# Patient Record
Sex: Female | Born: 1940 | Race: White | Hispanic: No | Marital: Married | Smoking: Never smoker
Health system: Southern US, Community
[De-identification: ages and names within clinical notes are randomized; demographics above are authoritative.]

## PROBLEM LIST (undated history)

## (undated) DIAGNOSIS — I10 Essential (primary) hypertension: Secondary | ICD-10-CM

## (undated) DIAGNOSIS — IMO0002 Reserved for concepts with insufficient information to code with codable children: Secondary | ICD-10-CM

## (undated) DIAGNOSIS — N390 Urinary tract infection, site not specified: Secondary | ICD-10-CM

## (undated) DIAGNOSIS — N6019 Diffuse cystic mastopathy of unspecified breast: Secondary | ICD-10-CM

## (undated) DIAGNOSIS — IMO0001 Reserved for inherently not codable concepts without codable children: Secondary | ICD-10-CM

## (undated) DIAGNOSIS — F329 Major depressive disorder, single episode, unspecified: Secondary | ICD-10-CM

## (undated) DIAGNOSIS — I452 Bifascicular block: Secondary | ICD-10-CM

## (undated) DIAGNOSIS — F32A Depression, unspecified: Secondary | ICD-10-CM

## (undated) DIAGNOSIS — E039 Hypothyroidism, unspecified: Secondary | ICD-10-CM

## (undated) HISTORY — DX: Major depressive disorder, single episode, unspecified: F32.9

## (undated) HISTORY — DX: Reserved for concepts with insufficient information to code with codable children: IMO0002

## (undated) HISTORY — PX: OTHER SURGICAL HISTORY: SHX169

## (undated) HISTORY — DX: Diffuse cystic mastopathy of unspecified breast: N60.19

## (undated) HISTORY — DX: Reserved for inherently not codable concepts without codable children: IMO0001

## (undated) HISTORY — PX: ABDOMINAL HYSTERECTOMY: SHX81

## (undated) HISTORY — DX: Essential (primary) hypertension: I10

## (undated) HISTORY — DX: Bifascicular block: I45.2

## (undated) HISTORY — PX: APPENDECTOMY: SHX54

## (undated) HISTORY — DX: Urinary tract infection, site not specified: N39.0

## (undated) HISTORY — DX: Depression, unspecified: F32.A

---

## 2005-04-25 ENCOUNTER — Ambulatory Visit: Payer: Self-pay | Admitting: Internal Medicine

## 2006-04-28 ENCOUNTER — Ambulatory Visit: Payer: Self-pay | Admitting: Internal Medicine

## 2007-10-01 ENCOUNTER — Ambulatory Visit: Payer: Self-pay | Admitting: Internal Medicine

## 2008-10-18 ENCOUNTER — Encounter: Payer: Self-pay | Admitting: Cardiology

## 2008-10-26 ENCOUNTER — Ambulatory Visit: Payer: Self-pay | Admitting: Internal Medicine

## 2008-11-14 ENCOUNTER — Ambulatory Visit: Payer: Self-pay | Admitting: Surgery

## 2008-11-18 ENCOUNTER — Ambulatory Visit: Payer: Self-pay | Admitting: Surgery

## 2009-02-27 ENCOUNTER — Encounter: Payer: Self-pay | Admitting: Cardiology

## 2009-03-03 ENCOUNTER — Encounter (INDEPENDENT_AMBULATORY_CARE_PROVIDER_SITE_OTHER): Payer: Self-pay | Admitting: Surgery

## 2009-03-03 ENCOUNTER — Ambulatory Visit (HOSPITAL_COMMUNITY): Admission: RE | Admit: 2009-03-03 | Discharge: 2009-03-04 | Payer: Self-pay | Admitting: Surgery

## 2009-04-19 ENCOUNTER — Encounter: Payer: Self-pay | Admitting: Cardiology

## 2009-04-25 ENCOUNTER — Encounter: Payer: Self-pay | Admitting: Cardiology

## 2009-04-25 ENCOUNTER — Ambulatory Visit: Payer: Self-pay

## 2009-04-25 ENCOUNTER — Ambulatory Visit: Payer: Self-pay | Admitting: Cardiology

## 2009-04-25 DIAGNOSIS — I10 Essential (primary) hypertension: Secondary | ICD-10-CM | POA: Insufficient documentation

## 2009-04-25 DIAGNOSIS — R9431 Abnormal electrocardiogram [ECG] [EKG]: Secondary | ICD-10-CM

## 2009-05-09 ENCOUNTER — Ambulatory Visit: Payer: Self-pay | Admitting: Cardiovascular Disease

## 2009-05-09 ENCOUNTER — Encounter: Payer: Self-pay | Admitting: Cardiology

## 2009-05-10 LAB — CONVERTED CEMR LAB
CO2: 24 meq/L (ref 19–32)
Calcium: 9.2 mg/dL (ref 8.4–10.5)
Chloride: 103 meq/L (ref 96–112)
HDL: 58 mg/dL (ref >39)
LDL Cholesterol: 120 mg/dL — ABNORMAL HIGH (ref 0–99)
Sodium: 141 meq/L (ref 135–145)
Total CHOL/HDL Ratio: 3.4
Triglycerides: 87 mg/dL (ref <150)

## 2009-05-22 ENCOUNTER — Ambulatory Visit: Payer: Self-pay | Admitting: Cardiology

## 2009-11-08 ENCOUNTER — Ambulatory Visit: Payer: Self-pay | Admitting: Internal Medicine

## 2010-06-26 ENCOUNTER — Encounter: Payer: Self-pay | Admitting: Cardiovascular Disease

## 2010-06-26 ENCOUNTER — Ambulatory Visit: Payer: Self-pay | Admitting: Cardiovascular Disease

## 2010-08-02 NOTE — Assessment & Plan Note (Signed)
Summary: EC6/AMD   Visit Type:  Initial Consult Referring Fusae Florio:  Aram Beecham, MD Primary Landrum Carbonell:  Osborne Oman, MD  CC:  establish care. "doing well" denies chest pain and SOB.Lorraine Ferguson  History of Present Illness: 70 yo with history of HTN and hyperparathyroidism s/p parathyroidectomy, 9/10,   ABN EKG with RBBB and left anterior fascicular block on her ECG, which was the same from  [facility] 10-12 years ago, presents for routine followup.  She states that she is very sedentary. She does not do any walking or exercise. her husband is active. She denies any significant new complaints. No lightheadedness or dizziness. she has not been checking her blood pressure at home though she does have a blood pressure cuff. She does report that the Diovan he is quite extensive. When asked if she is tried other medications, she used to be on Benicar though insurance stopped covering this.  Labs (11/10): K 4.2, creatinine 0.91, LDL 120, HDL 58  EKG shows normal sinus rhythm with rate of 75 beats per minute, right bundle branch block with left anterior fascicular block, nonspecific ST changes in leads V1 through V3, T-wave abnormality in 3 and aVF  Current Medications (verified): 1)  Aspirin 81 Mg Tbec (Aspirin) .... Take One Tablet By Mouth Daily 2)  Fish Oil 1000 Mg Caps (Omega-3 Fatty Acids) .Lorraine Ferguson.. 1 By Mouth Once Daily 3)  Diovan 320 Mg Tabs (Valsartan) .Lorraine Ferguson.. 1 Tab By Mouth Daily 4)  Vitamin B-12 Injection .Lorraine Ferguson.. 1 Injection Monthly 5)  Vitamin D .... 1 Tablet Daily  Allergies (verified): No Known Drug Allergies  Past History:  Past Medical History: Last updated: 05/22/2009 1. HTN 2. History of Chiari malformation with cervical syringomyelia, s/p surgery at [facility] 10-12 years ago.  3. Depression 4. Recurrent UTIs and urinary incontinence.  5. Hyperparathyroidism s/p parathyroidectomy in 9/10 6. Fibrocystic breast disease 7. Echo (10/10): EF 50-55%, mild LVH, trivial AI, no other valvular  disease. 8. RBBB with LAFB  Past Surgical History: Last updated: 05-07-2009 Chiari Malformation Parathyroid surgery Leg vein hysterectomy  Family History: Last updated: May 07, 2009 Mother: deceased 80; aspirated, Alzheimers, stroke Father: deceased 49: heart failure; heart valve replaced Brother: living, has pacemaker No premature CAD    Social History: Last updated: May 07, 2009 Retired, lives in [place] Married  Tobacco Use - No. Never smoked.  Alcohol Use - no Regular Exercise - no Drug Use - no  Risk Factors: Alcohol Use: 0 (2009-05-07) Caffeine Use: 2/3 cups (05-07-09) Exercise: no (May 07, 2009)  Risk Factors: Smoking Status: never (May 07, 2009)  Review of Systems  The patient denies fever, weight loss, weight gain, vision loss, decreased hearing, hoarseness, chest pain, syncope, dyspnea on exertion, peripheral edema, prolonged cough, abdominal pain, incontinence, muscle weakness, depression, and enlarged lymph nodes.    Vital Signs:  Patient profile:   70 year old female Height:      62 inches Weight:      134.25 pounds BMI:     24.64 Pulse rate:   75 / minute BP sitting:   142 / 72  (left arm) Cuff size:   regular  Vitals Entered By: Lysbeth Galas CMA (June 26, 2010 2:38 PM)  Physical Exam  General:  Well developed, well nourished, in no acute distress. Head:  normocephalic and atraumatic Neck:  Neck supple, no JVD. No masses, thyromegaly or abnormal cervical nodes. Lungs:  Clear bilaterally to auscultation and percussion. Heart:  Non-displaced PMI, chest non-tender; regular rate and rhythm, S1, S2 without murmurs or gallops. Soft S4. Carotid  upstroke normal, no bruit.  Pedals normal pulses. No edema, no varicosities. Abdomen:  Bowel sounds positive; abdomen soft and non-tender without masses Msk:  Back normal, normal gait. Muscle strength and tone normal. Pulses:  pulses normal in all 4 extremities Extremities:  No clubbing or  cyanosis. Neurologic:  Alert and oriented x 3. Skin:  Intact without lesions or rashes. Psych:  she does seem to be somewhat depressed.   Impression & Recommendations:  Problem # 1:  ELECTROCARDIOGRAM, ABNORMAL (ICD-794.31) abnormal EKG which has been the same over many years. No symptoms at this time. No further workup.  Her updated medication list for this problem includes:    Aspirin 81 Mg Tbec (Aspirin) .Lorraine Ferguson... Take one tablet by mouth daily  Problem # 2:  HYPERTENSION, UNSPECIFIED (ICD-401.9) Blood pressure is adequate. She does report that Diovan is very expensive for her. We will try losartan 100 mg daily and have asked her to monitor her pressure at home and call our office with some numbers. If it is elevated, we could either change her back to Diovan or add low-dose HCTZ.  The following medications were removed from the medication list:    Diovan 320 Mg Tabs (Valsartan) .Lorraine Ferguson... 1 tab by mouth daily Her updated medication list for this problem includes:    Aspirin 81 Mg Tbec (Aspirin) .Lorraine Ferguson... Take one tablet by mouth daily    Losartan Potassium 100 Mg Tabs (Losartan potassium) .Lorraine Ferguson... Take one tablet by mouth once daily.  Patient Instructions: 1)  Your physician recommends that you schedule a follow-up appointment in: 1 year 2)  Your physician has recommended you make the following change in your medication: STOP Diovan. START Losartan 100mg  once daily. Prescriptions: LOSARTAN POTASSIUM 100 MG TABS (LOSARTAN POTASSIUM) Take one tablet by mouth once daily.  #90 x 3   Entered by:   Lanny Hurst RN   Authorized by:   Dossie Arbour MD   Signed by:   Lanny Hurst RN on 06/26/2010   Method used:   Faxed to ...       [facility] (mail-order)             , [place]         Ph: [phone]       Fax: [phone]   RxID:   [id number]

## 2010-09-28 ENCOUNTER — Telehealth: Payer: Self-pay | Admitting: Cardiovascular Disease

## 2010-09-28 MED ORDER — HYDROCHLOROTHIAZIDE 25 MG PO TABS: 12.5000 mg | ORAL_TABLET | Freq: Every day | ORAL | Status: DC

## 2010-09-28 NOTE — Telephone Encounter (Signed)
Pt started Losartan 2 months ago and BP has been elevated for the past several weeks.

## 2010-09-28 NOTE — Telephone Encounter (Signed)
Pt was changed from Diovan to Losartan 100mg  in 05/2010 mainly because it was cheaper for pt. Pt states she has been doing well on this med, except last two weeks her BP has been elevated ~ 140-160/90-100. Reviewed with Dr. Mariah Milling here in office, advised pt to start HCTZ 12.5mg  qd  in addition to Losartan qd and monitor BP and call our office Monday to see how it has been running. Pt ok with this.

## 2010-09-30 NOTE — Telephone Encounter (Signed)
She needs to get a BMET on this new regimen in 2 wks

## 2010-10-01 NOTE — Telephone Encounter (Signed)
Pt notified of msg below, scheduled for labwork next week. Pt states that her BP has actually been doing much better on the Losartan ave 130s/ 75-90.

## 2010-10-05 LAB — CALCIUM: Calcium: 9 mg/dL (ref 8.4–10.5)

## 2010-10-06 LAB — URINALYSIS, ROUTINE W REFLEX MICROSCOPIC
Bilirubin Urine: NEGATIVE
Glucose, UA: NEGATIVE mg/dL
Ketones, ur: NEGATIVE mg/dL
Protein, ur: NEGATIVE mg/dL
Urobilinogen, UA: 0.2 mg/dL (ref 0.0–1.0)

## 2010-10-06 LAB — URINE MICROSCOPIC-ADD ON

## 2010-10-06 LAB — DIFFERENTIAL
Basophils Absolute: 0 10*3/uL (ref 0.0–0.1)
Basophils Relative: 1 % (ref 0–1)
Eosinophils Absolute: 0.3 10*3/uL (ref 0.0–0.7)
Eosinophils Relative: 5 % (ref 0–5)
Lymphocytes Relative: 30 % (ref 12–46)
Monocytes Absolute: 0.5 10*3/uL (ref 0.1–1.0)

## 2010-10-06 LAB — CBC
HCT: 41.5 % (ref 36.0–46.0)
Hemoglobin: 14 g/dL (ref 12.0–15.0)
MCHC: 33.8 g/dL (ref 30.0–36.0)
Platelets: 242 10*3/uL (ref 150–400)
RDW: 13.8 % (ref 11.5–15.5)

## 2010-10-06 LAB — BASIC METABOLIC PANEL
BUN: 13 mg/dL (ref 6–23)
CO2: 28 mEq/L (ref 19–32)
GFR calc non Af Amer: >60 mL/min (ref >60)
Glucose, Bld: 100 mg/dL — ABNORMAL HIGH (ref 70–99)
Potassium: 4.6 mEq/L (ref 3.5–5.1)

## 2010-10-06 LAB — PROTIME-INR: Prothrombin Time: 11.9 seconds (ref 11.6–15.2)

## 2010-10-12 ENCOUNTER — Other Ambulatory Visit (INDEPENDENT_AMBULATORY_CARE_PROVIDER_SITE_OTHER): Payer: MEDICARE | Admitting: *Deleted

## 2010-10-12 VITALS — BP 118/62

## 2010-10-12 DIAGNOSIS — Z79899 Other long term (current) drug therapy: Secondary | ICD-10-CM

## 2010-10-12 DIAGNOSIS — I10 Essential (primary) hypertension: Secondary | ICD-10-CM

## 2010-10-12 LAB — BASIC METABOLIC PANEL
CO2: 27 mEq/L (ref 19–32)
Chloride: 102 mEq/L (ref 96–112)
Creat: 0.84 mg/dL (ref 0.40–1.20)
Potassium: 4.2 mEq/L (ref 3.5–5.3)
Sodium: 140 mEq/L (ref 135–145)

## 2010-10-18 ENCOUNTER — Encounter: Payer: Self-pay | Admitting: Cardiovascular Disease

## 2010-11-14 ENCOUNTER — Ambulatory Visit: Payer: Self-pay | Admitting: Internal Medicine

## 2010-12-26 ENCOUNTER — Ambulatory Visit: Payer: Self-pay

## 2011-01-15 ENCOUNTER — Encounter: Payer: Self-pay | Admitting: Cardiovascular Disease

## 2011-05-24 ENCOUNTER — Emergency Department: Payer: Self-pay | Admitting: Emergency Medicine

## 2011-06-04 ENCOUNTER — Encounter: Payer: Self-pay | Admitting: Cardiovascular Disease

## 2011-06-11 ENCOUNTER — Encounter: Payer: Self-pay | Admitting: Cardiovascular Disease

## 2011-06-11 ENCOUNTER — Ambulatory Visit (INDEPENDENT_AMBULATORY_CARE_PROVIDER_SITE_OTHER): Payer: Medicare Other | Admitting: Cardiovascular Disease

## 2011-06-11 VITALS — BP 132/72 | HR 106 | Ht 62.0 in | Wt 130.0 lb

## 2011-06-11 DIAGNOSIS — R000 Tachycardia, unspecified: Secondary | ICD-10-CM | POA: Insufficient documentation

## 2011-06-11 DIAGNOSIS — I10 Essential (primary) hypertension: Secondary | ICD-10-CM

## 2011-06-11 DIAGNOSIS — R9431 Abnormal electrocardiogram [ECG] [EKG]: Secondary | ICD-10-CM

## 2011-06-11 MED ORDER — METOPROLOL TARTRATE 25 MG PO TABS: 25.0000 mg | ORAL_TABLET | Freq: Two times a day (BID) | ORAL | Status: DC

## 2011-06-11 MED ORDER — HYDROCHLOROTHIAZIDE 25 MG PO TABS: 25.0000 mg | ORAL_TABLET | Freq: Every day | ORAL | Status: DC

## 2011-06-11 NOTE — Patient Instructions (Addendum)
You are doing well. Please take the metoprolol twice a day as needed for fast heart rate Take a full HCTZ pill as needed for high blood pressure   Please call us if you have new issues that need to be addressed before your next appt.  The office will contact you for a follow up Appt. In 6 months

## 2011-06-11 NOTE — Progress Notes (Signed)
Patient ID: Lorraine Ferguson, female    DOB: 02-14-1941, 70 y.o.   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   She states that she is very sedentary. She does not do any walking or exercise.  She denies any significant new complaints. No lightheadedness or dizziness. She reports that her blood pressure has been mildly elevated, sometimes with systolic pressures in the 150 range. She has been taking Adderall for fatigue and was on this regularly for several months though has not been taking it for the past several days. She feels better on the medication. She has been taking losartan on a regular basis though only taking HCTZ one half on an infrequent basis She recently fell in the bathtub and hurt her neck and lower back.    EKG shows normal sinus rhythm with rate of 106 beats per minute, right bundle branch block with left anterior fascicular block, T-wave abnormality in V1 to V4   Recent labs showed total cholesterol 176, LDL 109     Outpatient Encounter Prescriptions as of 06/11/2011  Medication Sig Dispense Refill  . aspirin (ASPIR-81) 81 MG EC tablet Take 81 mg by mouth daily.        . Cyanocobalamin (VITAMIN B-12 IJ) Inject as directed every 30 (thirty) days.        . hydrochlorothiazide (HYDRODIURIL) 25 MG tablet Take 1 tablet (25 mg total) by mouth daily.  90 tablet  3  . losartan (COZAAR) 100 MG tablet Take 100 mg by mouth daily.        . Omega-3 Fatty Acids (FISH OIL) 1000 MG CAPS Take by mouth daily.        Marland Kitchen VITAMIN D, CHOLECALCIFEROL, PO Take by mouth daily.        Marland Kitchen DISCONTD: hydrochlorothiazide 25 MG tablet Take 0.5 tablets (12.5 mg total) by mouth daily.  30 tablet  6  . metoprolol tartrate (LOPRESSOR) 25 MG tablet Take 1 tablet (25 mg total) by mouth 2 (two) times  daily.  60 tablet  11     Review of Systems  Constitutional: Positive for fatigue.  HENT: Negative.   Eyes: Negative.   Respiratory: Negative.   Cardiovascular: Negative.   Gastrointestinal: Negative.   Musculoskeletal: Positive for back pain.  Skin: Negative.   Neurological: Negative.   Hematological: Negative.   Psychiatric/Behavioral: Positive for dysphoric mood.  All other systems reviewed and are negative.    BP 132/72  Pulse 106  Ht 5\' 2"  (1.575 m)  Wt 130 lb (58.968 kg)  BMI 23.78 kg/m2   Physical Exam  Nursing note and vitals reviewed. Constitutional: She is oriented to person, place, and time. She appears well-developed and well-nourished.  HENT:  Head: Normocephalic.  Nose: Nose normal.  Mouth/Throat: Oropharynx is clear and moist.  Eyes: Conjunctivae are normal. Pupils are equal, round, and reactive to light.  Neck: Normal range of motion. Neck supple. No JVD present.  Cardiovascular: Regular rhythm, S1 normal, S2 normal and intact distal pulses.  Tachycardia present.  Exam reveals no gallop and no friction rub.   Murmur heard.  Crescendo systolic murmur is present with a grade of 2/6  Pulmonary/Chest: Effort normal and breath sounds normal. No respiratory distress. She has no wheezes. She has no rales. She exhibits no tenderness.  Abdominal: Soft. Bowel sounds are normal. She exhibits no distension. There is no tenderness.  Musculoskeletal: Normal range of motion. She exhibits no edema and no tenderness.  Lymphadenopathy:    She has no cervical adenopathy.  Neurological: She is alert and oriented to person, place, and time. Coordination normal.  Skin: Skin is warm and dry. No rash noted. No erythema.  Psychiatric: She has a normal mood and affect. Her behavior is normal. Judgment and thought content normal.         Assessment and Plan

## 2011-06-11 NOTE — Assessment & Plan Note (Signed)
Heart rate is elevated today. Uncertain if this is secondary to withdrawal from Adderall. She has indicated that she will likely go back on the medication as it makes her feel better.  Certainly this could be causing a tachycardia. We have called in a prescription for metoprolol tartrate 25 mg and have suggested she take this only as needed for heart rate greater then 90.

## 2011-06-11 NOTE — Assessment & Plan Note (Signed)
She reports hypertension at home. We have suggested she increase her HCTZ to 25 mg daily and take metoprolol tartrate as needed for tachycardia. Continue the losartan.

## 2011-06-11 NOTE — Assessment & Plan Note (Signed)
No change in her EKG apart from the tachycardia.

## 2011-07-12 ENCOUNTER — Encounter: Payer: Self-pay | Admitting: Orthopedic Surgery

## 2011-08-02 ENCOUNTER — Encounter: Payer: Self-pay | Admitting: Orthopedic Surgery

## 2011-08-02 ENCOUNTER — Telehealth: Payer: Self-pay | Admitting: Cardiovascular Disease

## 2011-08-02 MED ORDER — LOSARTAN POTASSIUM 100 MG PO TABS: 100.0000 mg | ORAL_TABLET | Freq: Every day | ORAL | Status: DC

## 2011-08-02 NOTE — Telephone Encounter (Signed)
Refill losartan

## 2011-08-02 NOTE — Telephone Encounter (Signed)
90 day with refills

## 2011-11-20 ENCOUNTER — Ambulatory Visit: Payer: Self-pay | Admitting: Internal Medicine

## 2012-02-03 ENCOUNTER — Encounter: Payer: Self-pay | Admitting: Cardiovascular Disease

## 2012-02-03 ENCOUNTER — Ambulatory Visit (INDEPENDENT_AMBULATORY_CARE_PROVIDER_SITE_OTHER): Payer: Medicare Other | Admitting: Cardiovascular Disease

## 2012-02-03 VITALS — BP 130/78 | HR 76 | Ht 62.0 in | Wt 124.8 lb

## 2012-02-03 DIAGNOSIS — I10 Essential (primary) hypertension: Secondary | ICD-10-CM

## 2012-02-03 DIAGNOSIS — R000 Tachycardia, unspecified: Secondary | ICD-10-CM

## 2012-02-03 MED ORDER — LOSARTAN POTASSIUM 100 MG PO TABS: 100.0000 mg | ORAL_TABLET | Freq: Every day | ORAL | Status: DC

## 2012-02-03 NOTE — Assessment & Plan Note (Signed)
She is currently not on Adderall. She takes metoprolol when necessary for palpitations.

## 2012-02-03 NOTE — Assessment & Plan Note (Signed)
Blood pressure is well controlled on today's visit. No changes made to the medications. 

## 2012-02-03 NOTE — Progress Notes (Signed)
Patient ID: Lorraine Ferguson, female    DOB: 02/01/1941, 71 y.o.   MRN: [id number]  HPI Comments: 71 yo  woman with history of HTN and hyperparathyroidism s/p parathyroidectomy 9/10,   ABN EKG with RBBB and left anterior fascicular block on her ECG, which was the same from [facility] 10-12 years ago, presents for routine followup.   Overall she reports that everything is about the same. Blood pressure has been relatively stable. She only takes losartan. She takes metoprolol and HCTZ periodically or palpitations or edema respectively. She does not exercise on a regular basis. She has no new complaints. She did comment that an antibiotic she took previously for upper respiratory infection cause significant mental damage in her shoulder. She had a cortisone shot in her shoulder with improvement of her symptoms.   EKG shows normal sinus rhythm with rate 76 beats per minute, APCs, right bundle branch block, left anterior fascicular block Total cholesterol 176, LDL 109    Outpatient Encounter Prescriptions as of 02/03/2012  Medication Sig Dispense Refill  . aspirin 325 MG tablet Take 325 mg by mouth daily.      . Cyanocobalamin (VITAMIN B-12 IJ) Inject as directed every 30 (thirty) days.        . hydrochlorothiazide (HYDRODIURIL) 25 MG tablet Take 25 mg by mouth daily as needed.      Marland Kitchen losartan (COZAAR) 100 MG tablet Take 1 tablet (100 mg total) by mouth daily.  90 tablet  3  . metoprolol tartrate (LOPRESSOR) 25 MG tablet Take 25 mg by mouth 2 (two) times daily as needed.      . Omega-3 Fatty Acids (FISH OIL) 1000 MG CAPS Take by mouth daily.        Marland Kitchen VITAMIN D, CHOLECALCIFEROL, PO Take by mouth daily.          Review of Systems  Constitutional: Positive for fatigue.  HENT: Negative.   Eyes: Negative.   Respiratory: Negative.   Cardiovascular: Negative.   Gastrointestinal: Negative.   Musculoskeletal: Positive for back pain.  Skin: Negative.   Neurological: Negative.   Hematological: Negative.     Psychiatric/Behavioral: Positive for dysphoric mood.  All other systems reviewed and are negative.    BP 130/78  Pulse 76  Ht 5\' 2"  (1.575 m)  Wt 124 lb 12 oz (56.586 kg)  BMI 22.82 kg/m2  Physical Exam  Nursing note and vitals reviewed. Constitutional: She is oriented to person, place, and time. She appears well-developed and well-nourished.  HENT:  Head: Normocephalic.  Nose: Nose normal.  Mouth/Throat: Oropharynx is clear and moist.  Eyes: Conjunctivae are normal. Pupils are equal, round, and reactive to light.  Neck: Normal range of motion. Neck supple. No JVD present.  Cardiovascular: Regular rhythm, S1 normal, S2 normal and intact distal pulses.  Tachycardia present.  Exam reveals no gallop and no friction rub.   Murmur heard.  Crescendo systolic murmur is present with a grade of 2/6  Pulmonary/Chest: Effort normal and breath sounds normal. No respiratory distress. She has no wheezes. She has no rales. She exhibits no tenderness.  Abdominal: Soft. Bowel sounds are normal. She exhibits no distension. There is no tenderness.  Musculoskeletal: Normal range of motion. She exhibits no edema and no tenderness.  Lymphadenopathy:    She has no cervical adenopathy.  Neurological: She is alert and oriented to person, place, and time. Coordination normal.  Skin: Skin is warm and dry. No rash noted. No erythema.  Psychiatric: She has a normal  mood and affect. Her behavior is normal. Judgment and thought content normal.         Assessment and Plan

## 2012-02-03 NOTE — Patient Instructions (Signed)
You are doing well. No medication changes were made.  Please call us if you have new issues that need to be addressed before your next appt.  Your physician wants you to follow-up in: 12 months.  You will receive a reminder letter in the mail two months in advance. If you don't receive a letter, please call our office to schedule the follow-up appointment. 

## 2012-05-15 ENCOUNTER — Ambulatory Visit: Payer: Self-pay | Admitting: Internal Medicine

## 2012-11-20 ENCOUNTER — Ambulatory Visit: Payer: Self-pay | Admitting: Internal Medicine

## 2013-02-09 ENCOUNTER — Ambulatory Visit: Payer: Medicare Other | Admitting: Cardiovascular Disease

## 2013-03-05 ENCOUNTER — Ambulatory Visit: Payer: Medicare Other | Admitting: Cardiovascular Disease

## 2013-03-11 ENCOUNTER — Ambulatory Visit (INDEPENDENT_AMBULATORY_CARE_PROVIDER_SITE_OTHER): Payer: Medicare Other | Admitting: Cardiovascular Disease

## 2013-03-11 ENCOUNTER — Encounter: Payer: Self-pay | Admitting: Cardiovascular Disease

## 2013-03-11 VITALS — BP 117/78 | HR 85 | Ht 62.0 in | Wt 128.5 lb

## 2013-03-11 DIAGNOSIS — I10 Essential (primary) hypertension: Secondary | ICD-10-CM

## 2013-03-11 DIAGNOSIS — R000 Tachycardia, unspecified: Secondary | ICD-10-CM

## 2013-03-11 DIAGNOSIS — R9431 Abnormal electrocardiogram [ECG] [EKG]: Secondary | ICD-10-CM

## 2013-03-11 NOTE — Assessment & Plan Note (Signed)
EKG essentially unchanged. No further workup at this time.

## 2013-03-11 NOTE — Patient Instructions (Addendum)
You are doing well. No medication changes were made.  Please call us if you have new issues that need to be addressed before your next appt.  Your physician wants you to follow-up in: 12 months.  You will receive a reminder letter in the mail two months in advance. If you don't receive a letter, please call our office to schedule the follow-up appointment. 

## 2013-03-11 NOTE — Progress Notes (Signed)
   Patient ID: Lorraine Ferguson, female    DOB: [birth year], 72 y.o.   MRN: [id number]  HPI Comments: 72 yo  woman with history of HTN and hyperparathyroidism s/p parathyroidectomy 9/10,   ABN EKG with RBBB and left anterior fascicular block on her ECG, which was the same from [facility] >10 years ago, presents for routine followup.   In general she reports that she is doing well. She does state that food is sticking when she swallows. She is scheduled for EGD and colonoscopy with Dr. Mechele Collin 04/08/2013.   Blood pressure has been relatively stable. She  takes losartan HCTZ. She does report that occasionally she alternates with losartan without HCTZ . She does not check her blood pressures at home  She does not exercise on a regular basis. She has no new complaints.   EKG shows normal sinus rhythm with rate 85 beats per minute, APCs, right bundle branch block, left anterior fascicular block Total cholesterol 176, LDL 109   Outpatient Encounter Prescriptions as of 03/11/2013  Medication Sig Dispense Refill  . Amphetamine-Dextroamphetamine (ADDERALL PO) Take by mouth daily.      Marland Kitchen aspirin 325 MG tablet Take 325 mg by mouth daily.      Marland Kitchen losartan-hydrochlorothiazide (HYZAAR) 100-12.5 MG per tablet Take 1 tablet by mouth daily.      Marland Kitchen VITAMIN D, CHOLECALCIFEROL, PO Take by mouth as needed.         Review of Systems  Constitutional: Positive for fatigue.  HENT: Negative.   Eyes: Negative.   Respiratory: Negative.   Cardiovascular: Negative.   Gastrointestinal: Negative.   Endocrine: Negative.   Musculoskeletal: Positive for back pain.  Skin: Negative.   Allergic/Immunologic: Negative.   Neurological: Negative.   Hematological: Negative.   Psychiatric/Behavioral: Positive for dysphoric mood.  All other systems reviewed and are negative.    BP 104/78  Pulse 85  Ht 5\' 2"  (1.575 m)  Wt 128 lb 8 oz (58.287 kg)  BMI 23.5 kg/m2  Physical Exam  Nursing note and vitals reviewed. Constitutional:  She is oriented to person, place, and time. She appears well-developed and well-nourished.  HENT:  Head: Normocephalic.  Nose: Nose normal.  Mouth/Throat: Oropharynx is clear and moist.  Eyes: Conjunctivae are normal. Pupils are equal, round, and reactive to light.  Neck: Normal range of motion. Neck supple. No JVD present.  Cardiovascular: Regular rhythm, S1 normal, S2 normal and intact distal pulses.  Tachycardia present.  Exam reveals no gallop and no friction rub.   Murmur heard.  Crescendo systolic murmur is present with a grade of 2/6  Pulmonary/Chest: Effort normal and breath sounds normal. No respiratory distress. She has no wheezes. She has no rales. She exhibits no tenderness.  Abdominal: Soft. Bowel sounds are normal. She exhibits no distension. There is no tenderness.  Musculoskeletal: Normal range of motion. She exhibits no edema and no tenderness.  Lymphadenopathy:    She has no cervical adenopathy.  Neurological: She is alert and oriented to person, place, and time. Coordination normal.  Skin: Skin is warm and dry. No rash noted. No erythema.  Psychiatric: She has a normal mood and affect. Her behavior is normal. Judgment and thought content normal.    Assessment and Plan

## 2013-03-11 NOTE — Assessment & Plan Note (Signed)
Suggested she continue on the losartan HCTZ. Blood pressure well controlled

## 2013-04-08 ENCOUNTER — Ambulatory Visit: Payer: Self-pay | Admitting: Unknown Physician Specialty

## 2013-05-10 ENCOUNTER — Ambulatory Visit (INDEPENDENT_AMBULATORY_CARE_PROVIDER_SITE_OTHER): Payer: Medicare Other

## 2013-05-10 ENCOUNTER — Ambulatory Visit (INDEPENDENT_AMBULATORY_CARE_PROVIDER_SITE_OTHER): Payer: Medicare Other | Admitting: Podiatry

## 2013-05-10 ENCOUNTER — Encounter: Payer: Self-pay | Admitting: Podiatry

## 2013-05-10 VITALS — BP 158/98 | HR 81 | Resp 16 | Ht 62.0 in | Wt 130.0 lb

## 2013-05-10 DIAGNOSIS — M79609 Pain in unspecified limb: Secondary | ICD-10-CM

## 2013-05-10 DIAGNOSIS — M79671 Pain in right foot: Secondary | ICD-10-CM

## 2013-05-10 DIAGNOSIS — L600 Ingrowing nail: Secondary | ICD-10-CM

## 2013-05-10 DIAGNOSIS — S9031XA Contusion of right foot, initial encounter: Secondary | ICD-10-CM

## 2013-05-10 MED ORDER — NEOMYCIN-POLYMYXIN-HC 3.5-10000-1 OT SOLN: OTIC | Status: DC

## 2013-05-10 NOTE — Progress Notes (Signed)
And presents today as a 71 year old white female with a chief complaint of painful ingrown toenails to the hallux right states has been like this for the past week or so slowly seems to be be getting worse I thought they were doing better now they seem to be becoming more painful again. She's really done nothing to aggravate this her to encouraged she says wearing open toe shoes in the summertime should help. She tries trimming it out and soaking in Epsom salts.  Objective: Vital signs are stable she is alert and oriented x3. I have reviewed her past medical history medications and allergies. Bilateral lower extremity evaluation demonstrates: Strong palpable pulses bilateral lower extremity. Capillary fill time to digits one through 5 of the bilateral foot is immediate. Neurologic sensorium is intact bilateral. Deep tendon reflexes are intact bilateral. Cutaneous evaluation demonstrates supple well hydrated cutis with exception of sharp incurvated nail margins to the tibial and fibular border of the hallux right foot.  Assessment: Sharp incurvated nail margins. Ingrown nail hallux right.  Plan: we discussed the etiology pathology conservative versus surgical therapies at this point I will followup with her in one week in the [facility] office as to reevaluate. She will apply Cortisporin otic on a twice a day basis after soaking in warm water and Betadine. Questions or concerns she will notify us immediately.went ahead and perform chemical matrixectomy to the tibial border and fibular border of the hallux right. This was performed without incidence today patient tolerated procedure well.

## 2013-05-10 NOTE — Progress Notes (Signed)
N HURTS L RIGHT GREAT TOENAIL AND RIGHT FOOT DORSAL D 1+W O SLOWLY C BETTER A 0 T PT TRIMS TOENAIL, SOAK IN EPSON SALT, ICE

## 2013-05-10 NOTE — Patient Instructions (Signed)

## 2013-05-19 ENCOUNTER — Ambulatory Visit (INDEPENDENT_AMBULATORY_CARE_PROVIDER_SITE_OTHER): Payer: Medicare Other | Admitting: Podiatry

## 2013-05-19 ENCOUNTER — Encounter: Payer: Self-pay | Admitting: Podiatry

## 2013-05-19 VITALS — BP 133/87 | HR 80 | Resp 14 | Ht 62.0 in | Wt 130.0 lb

## 2013-05-19 DIAGNOSIS — Z9889 Other specified postprocedural states: Secondary | ICD-10-CM

## 2013-05-19 NOTE — Progress Notes (Signed)
And presents today for followup of her matrixectomy hallux. She's been soaking in Epsom salts and water. Applying Cortisporin Otic twice a day.  Objective: Hallux left demonstrates matrixectomy tibial and fibular border with subungual hematoma. Mild erythema and no signs of infection.  Assessment: Slowly resolving matrixectomy hallux left.  Plan: Continue to soak on a regular basis Epsom salts warm water twice daily with use of Cortisporin Otic covered in today and leave open at night continue to soak until completely healed appear

## 2013-12-22 ENCOUNTER — Telehealth: Payer: Self-pay

## 2013-12-22 NOTE — Telephone Encounter (Signed)
Spoke w/ pt.  She reports that her other MD changed her BP meds and she does not feel that it is working for her.  Offered to make pt appt sooner to see Dr. Mariah MillingGollan, but she states that she will [facility]. Reports BP systolic 140-150s, HR in the 60s.  Discussed normal ranges w/ pt.   She will call w/ further questions or concerns.

## 2013-12-22 NOTE — Telephone Encounter (Signed)
Pt called and states her PCP switched her BP medication, states that it has lowered her HR, but has not lowered her BP. Please call.

## 2014-01-14 ENCOUNTER — Ambulatory Visit: Payer: Self-pay | Admitting: Internal Medicine

## 2014-03-11 ENCOUNTER — Ambulatory Visit (INDEPENDENT_AMBULATORY_CARE_PROVIDER_SITE_OTHER): Payer: Medicare Other | Admitting: Cardiovascular Disease

## 2014-03-11 ENCOUNTER — Encounter: Payer: Self-pay | Admitting: Cardiovascular Disease

## 2014-03-11 VITALS — BP 130/70 | HR 64 | Ht 60.0 in | Wt 127.5 lb

## 2014-03-11 DIAGNOSIS — I10 Essential (primary) hypertension: Secondary | ICD-10-CM

## 2014-03-11 DIAGNOSIS — R000 Tachycardia, unspecified: Secondary | ICD-10-CM

## 2014-03-11 NOTE — Progress Notes (Signed)
Patient ID: Lorraine Ferguson, female    DOB: 10-24-1940, 73 y.o.   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  In followup today, she reports that she is doing well. She was started on low-dose beta blocker by Dr. Judithann Sheen for tachycardia. Initially she did not feel that the bystolic was helping her blood pressure. No significant side effects. She's not doing any regular exercise.  She reports that her house is messy. She has 2 dogs, one horse   Previously reported that  food was sticking when she swallows.  EGD and colonoscopy with Dr. Mechele Collin 04/08/2013.  She has no new complaints.   EKG shows normal sinus rhythm with rate 64 beats per minute, right bundle branch block, left anterior fascicular block  previous Total cholesterol 176, LDL 109   Outpatient Encounter Prescriptions as of 03/11/2014  Medication Sig  . Amphetamine-Dextroamphetamine (ADDERALL PO) Take 10 mg by mouth daily.   Marland Kitchen aspirin 325 MG tablet Take 325 mg by mouth daily.  Marland Kitchen BYSTOLIC 5 MG tablet Take 5 mg by mouth daily.   . Cyanocobalamin (RA VITAMIN B-12 TR) 1000 MCG TBCR Take by mouth daily.   Marland Kitchen esomeprazole (NEXIUM) 40 MG capsule Take 40 mg by mouth as needed.  Marland Kitchen losartan-hydrochlorothiazide (HYZAAR) 100-12.5 MG per tablet Take 1 tablet by mouth daily.  Marland Kitchen neomycin-polymyxin-hydrocortisone (CORTISPORIN) otic solution Apply one to two drops to toe after soaking twice daily  . VITAMIN D, CHOLECALCIFEROL, PO Take by mouth as needed.      Review of Systems  Constitutional: Positive for fatigue.  HENT: Negative.   Eyes: Negative.   Respiratory: Negative.   Cardiovascular: Negative.   Gastrointestinal: Negative.   Endocrine: Negative.   Musculoskeletal: Positive for back pain.  Skin: Negative.    Allergic/Immunologic: Negative.   Neurological: Negative.   Hematological: Negative.   Psychiatric/Behavioral: Positive for dysphoric mood.  All other systems reviewed and are negative.   BP 130/70  Pulse 64  Ht 5' (1.524 m)  Wt 127 lb 8 oz (57.834 kg)  BMI 24.90 kg/m2  Physical Exam  Nursing note and vitals reviewed. Constitutional: She is oriented to person, place, and time. She appears well-developed and well-nourished.  HENT:  Head: Normocephalic.  Nose: Nose normal.  Mouth/Throat: Oropharynx is clear and moist.  Eyes: Conjunctivae are normal. Pupils are equal, round, and reactive to light.  Neck: Normal range of motion. Neck supple. No JVD present.  Cardiovascular: Regular rhythm, S1 normal, S2 normal and intact distal pulses.  Tachycardia present.  Exam reveals no gallop and no friction rub.   Murmur heard.  Crescendo systolic murmur is present with a grade of 2/6  Pulmonary/Chest: Effort normal and breath sounds normal. No respiratory distress. She has no wheezes. She has no rales. She exhibits no tenderness.  Abdominal: Soft. Bowel sounds are normal. She exhibits no distension. There is no tenderness.  Musculoskeletal: Normal range of motion. She exhibits no edema and no tenderness.  Lymphadenopathy:    She has no cervical adenopathy.  Neurological: She is alert and oriented to person, place, and time. Coordination normal.  Skin: Skin is warm and dry. No rash noted. No erythema.  Psychiatric: She has a normal mood and affect. Her behavior is normal. Judgment and thought content normal.  Assessment and Plan

## 2014-03-11 NOTE — Assessment & Plan Note (Signed)
She denies any symptoms concerning for tachycardia. Tolerating low-dose beta blocker

## 2014-03-11 NOTE — Assessment & Plan Note (Signed)
Blood pressure is well controlled on today's visit. No changes made to the medications. Samples of bystolic given.

## 2014-03-11 NOTE — Patient Instructions (Signed)
You are doing well. No medication changes were made.  Please call us if you have new issues that need to be addressed before your next appt.  Your physician wants you to follow-up in: 12 months.  You will receive a reminder letter in the mail two months in advance. If you don't receive a letter, please call our office to schedule the follow-up appointment. 

## 2014-06-27 ENCOUNTER — Ambulatory Visit: Payer: Self-pay | Admitting: Urology

## 2015-07-06 ENCOUNTER — Encounter: Payer: Self-pay | Admitting: Cardiovascular Disease

## 2015-07-06 ENCOUNTER — Ambulatory Visit (INDEPENDENT_AMBULATORY_CARE_PROVIDER_SITE_OTHER): Payer: Medicare Other | Admitting: Cardiovascular Disease

## 2015-07-06 VITALS — BP 116/80 | HR 65 | Ht 59.0 in | Wt 133.5 lb

## 2015-07-06 DIAGNOSIS — I10 Essential (primary) hypertension: Secondary | ICD-10-CM

## 2015-07-06 DIAGNOSIS — R000 Tachycardia, unspecified: Secondary | ICD-10-CM | POA: Diagnosis not present

## 2015-07-06 NOTE — Assessment & Plan Note (Signed)
Blood pressure is well controlled on today's visit. No changes made to the medications. 

## 2015-07-06 NOTE — Assessment & Plan Note (Signed)
Heart rate well controlled. Recommend she continue her beta blocker

## 2015-07-06 NOTE — Progress Notes (Signed)
Patient ID: Lorraine Ferguson, female    DOB: 11/17/1940, 75 y.o.   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  EGD and colonoscopy with Dr. Mechele CollinElliott 04/08/2013.  She has no new complaints.    previous Total cholesterol 176, LDL 109   Allergies  Allergen Reactions  . Levofloxacin     Caused arm pain.    Current Outpatient Prescriptions on File Prior to Visit  Medication Sig Dispense Refill  . Amphetamine-Dextroamphetamine (ADDERALL PO) Take 10 mg by mouth daily.     Marland Kitchen. aspirin 325 MG tablet Take 325 mg by mouth daily.    Marland Kitchen. BYSTOLIC 5 MG tablet Take 5 mg by mouth daily.     . Cyanocobalamin (RA VITAMIN B-12 TR) 1000 MCG TBCR Take by mouth daily.     Marland Kitchen. esomeprazole (NEXIUM) 40 MG capsule Take 40 mg by mouth as needed.    Marland Kitchen. losartan-hydrochlorothiazide (HYZAAR) 100-12.5 MG per tablet Take 1 tablet by mouth daily.    Marland Kitchen. neomycin-polymyxin-hydrocortisone (CORTISPORIN) otic solution Apply one to two drops to toe after soaking twice daily 10 mL 0  . VITAMIN D, CHOLECALCIFEROL, PO Take by mouth as needed.      No current facility-administered medications on file prior to visit.    Past Medical History  Diagnosis Date  . Chiari malformation     hx with cervical syringomyelia, s/p surgery at [facility] 10-12 years ago   . Depression   . HTN (hypertension)   . Recurrent UTI     and urinary incontinence  . Hyperparathyroidism     s/p parathyroidectomy in 9/10  . Fibrocystic breast disease   . Normal echocardiogram     EF 50-55%, mild LVH, trivial AI, no other valvular disease  . RBBB (right bundle branch block with left anterior fascicular block)     Past Surgical History  Procedure Laterality Date  . Chiari malformation    . Parathyroid surgery    . Leg vein    . Hysterectomy (other)      Social History  reports that she has never smoked. She has never used smokeless tobacco. She reports that she does not drink alcohol or use illicit drugs.  Family History family history includes Alzheimer's disease in her mother; Heart failure in her father; Stroke in her mother.    Review of Systems  Constitutional: Positive for fatigue.  HENT: Negative.   Eyes: Negative.   Respiratory: Negative.   Cardiovascular: Negative.   Gastrointestinal: Negative.   Endocrine: Negative.   Musculoskeletal: Positive for back pain.  Skin: Negative.   Allergic/Immunologic: Negative.   Neurological: Negative.   Hematological: Negative.   Psychiatric/Behavioral: Positive for dysphoric mood.  All other systems reviewed and are negative.   BP 116/80 mmHg  Pulse 65  Ht 4\' 11"  (1.499 m)  Wt 133 lb 8 oz (60.555 kg)  BMI 26.95 kg/m2  Physical Exam  Constitutional: She is oriented to person, place, and time. She appears well-developed and well-nourished.  HENT:  Head: Normocephalic.  Nose: Nose normal.  Mouth/Throat: Oropharynx is clear and moist.  Eyes: Conjunctivae are normal. Pupils are equal, round, and reactive to light.  Neck: Normal range of motion. Neck supple. No JVD present.  Cardiovascular: Regular rhythm, S1 normal, S2 normal and intact distal pulses.  Tachycardia present.  Exam reveals no gallop and no friction rub.   Murmur heard.  Crescendo systolic murmur is present with a grade of 2/6   Pulmonary/Chest: Effort normal and breath sounds normal. No respiratory distress. She has no wheezes. She has no rales. She exhibits no tenderness.  Abdominal: Soft. Bowel sounds are normal. She exhibits no distension. There is no tenderness.  Musculoskeletal: Normal range of motion. She exhibits no edema or tenderness.  Lymphadenopathy:    She has no cervical adenopathy.  Neurological: She is alert and oriented to person, place, and time. Coordination normal.  Skin: Skin is warm and dry. No rash noted. No erythema.  Psychiatric: She has a normal mood and affect. Her behavior is normal. Judgment and thought content normal.    Assessment and Plan  Nursing note and vitals reviewed.

## 2015-07-06 NOTE — Patient Instructions (Signed)
You are doing well. No medication changes were made.  Please call us if you have new issues that need to be addressed before your next appt.  Your physician wants you to follow-up in: 12 months.  You will receive a reminder letter in the mail two months in advance. If you don't receive a letter, please call our office to schedule the follow-up appointment. 

## 2016-03-12 ENCOUNTER — Encounter: Payer: Self-pay | Admitting: Obstetrics and Gynecology

## 2016-03-12 ENCOUNTER — Ambulatory Visit (INDEPENDENT_AMBULATORY_CARE_PROVIDER_SITE_OTHER): Payer: Medicare Other | Admitting: Obstetrics and Gynecology

## 2016-03-12 VITALS — BP 158/82 | HR 65 | Ht 59.0 in | Wt 136.6 lb

## 2016-03-12 DIAGNOSIS — N3945 Continuous leakage: Secondary | ICD-10-CM

## 2016-03-12 DIAGNOSIS — N393 Stress incontinence (female) (male): Secondary | ICD-10-CM | POA: Diagnosis not present

## 2016-03-12 DIAGNOSIS — IMO0002 Reserved for concepts with insufficient information to code with codable children: Secondary | ICD-10-CM

## 2016-03-12 DIAGNOSIS — IMO0001 Reserved for inherently not codable concepts without codable children: Secondary | ICD-10-CM

## 2016-03-12 DIAGNOSIS — R32 Unspecified urinary incontinence: Secondary | ICD-10-CM | POA: Diagnosis not present

## 2016-03-12 DIAGNOSIS — N8110 Cystocele, unspecified: Secondary | ICD-10-CM

## 2016-03-12 NOTE — Progress Notes (Signed)
GYN ENCOUNTER NOTE  Subjective:       Lorraine Ferguson is a 75 y.o. 741P1001 female is here for gynecologic evaluation of the following issues:   1.Urinary incontinence. Started with coughing or sneezing years ago but now happens constantly through out the day. Urinary leaking is worse upon standing and walking. She wears pads daily having to change pads multiple times a day. Has one episode of urinary incontinenceHer ninth. Denies any fevers or dysuria. Denies any vaginal fullness or bulging.   Bowel function reportedly normal   Gynecologic History LMP: post menopausal Contraception: post menopausal status Last Pap: PCP following Last mammogram: 01/14/14. Results were: normal Hysterectomy  Obstetric History OB History  Gravida Para Term Preterm AB Living  1 1 1     1   SAB TAB Ectopic Multiple Live Births          1    # Outcome Date GA Lbr Len/2nd Weight Sex Delivery Anes PTL Lv  1 Term 1962   7 lb (3.175 kg) F Vag-Spont   LIV      Past Medical History:  Diagnosis Date  . Chiari malformation    hx with cervical syringomyelia, s/p surgery at [facility] 10-12 years ago  . Depression   . Fibrocystic breast disease   . HTN (hypertension)   . Hyperparathyroidism    s/p parathyroidectomy in 9/10  . Normal echocardiogram    EF 50-55%, mild LVH, trivial AI, no other valvular disease  . RBBB (right bundle branch block with left anterior fascicular block)   . Recurrent UTI    and urinary incontinence    Past Surgical History:  Procedure Laterality Date  . chiari malformation    . hysterectomy (other)    . leg vein    . parathyroid surgery      Current Outpatient Prescriptions on File Prior to Visit  Medication Sig Dispense Refill  . Amphetamine-Dextroamphetamine (ADDERALL PO) Take 10 mg by mouth daily.     Marland Kitchen. aspirin 325 MG tablet Take 325 mg by mouth daily.     No current facility-administered medications on file prior to visit.     Allergies  Allergen Reactions  .  Levofloxacin     Caused arm pain.    Social History   Social History  . Marital status: Married    Spouse name: N/A  . Number of children: N/A  . Years of education: N/A   Occupational History  . Not on file.   Social History Main Topics  . Smoking status: Never Smoker  . Smokeless tobacco: Never Used  . Alcohol use No  . Drug use: No  . Sexual activity: Yes    Birth control/ protection: Surgical   Other Topics Concern  . Not on file   Social History Narrative   Retired. Lives in [place]. Does not regularly exercise    Family History  Problem Relation Age of Onset  . Alzheimer's disease Mother   . Stroke Mother   . Breast cancer Mother   . Heart failure Father   . Ovarian cancer Neg Hx   . Colon cancer Neg Hx   . Diabetes Neg Hx     The following portions of the patient's history were reviewed and updated as appropriate: allergies, current medications, past family history, past medical history, past social history, past surgical history and problem list.  Review of Systems Review of Systems - General ROS: negative for - chills, fatigue, fever, hot flashes, malaise or night sweats  Hematological and Lymphatic ROS: negative for - bleeding problems or swollen lymph nodes Gastrointestinal ROS: negative for - abdominal pain, blood in stools, change in bowel habits and nausea/vomiting Musculoskeletal ROS: negative for - joint pain, muscle pain or muscular weakness Genito-Urinary ROS: negative for - change in menstrual cycle, dysmenorrhea, dyspareunia, dysuria, genital discharge, genital ulcers, hematuria, incontinence, irregular/heavy menses, nocturia or pelvic pain  Objective:   BP (!) 158/82   Pulse 65   Ht 4\' 11"  (1.499 m)   Wt 136 lb 9.6 oz (62 kg)   BMI 27.59 kg/m  CONSTITUTIONAL: Well-developed, well-nourished female in no acute distress. Short-term memory lapse noted HENT:  Normocephalic, atraumatic.  NECK: Normal range of motion, supple, no masses.   Normal thyroid.  SKIN: Skin is warm and dry. No rash noted. Not diaphoretic. No erythema. No pallor. NEUROLGIC: Alert and oriented to person, place, and time. PSYCHIATRIC: Normal mood and affect. Normal behavior. Normal judgment and thought content. CARDIOVASCULAR:Not Examined RESPIRATORY: Not Examined BREASTS: Not Examined ABDOMEN: Soft, non distended; Non tender.  No Organomegaly. PELVIC:  External Genitalia: Normal  BUS: Normal; Q-tip demonstrates 45 rotation with Valsalva  Vagina: cystocele grade 2, mild atrophy; good apical support; no significant rectocele  Cervix: Surgically absent  Uterus: Surgically absent  Adnexa: Normal  RV: Normal sphincter tone; no rectal masses  Bladder: Nontender MUSCULOSKELETAL: Normal range of motion. No tenderness.  No cyanosis, clubbing, or edema.  PROCEDURE: Postvoid residual-30 cc   Assessment:   Encounter Diagnoses  Name Primary?  . Urinary incontinence in female Yes  . Urinary incontinence with continuous leakage   . SUI (stress urinary incontinence, female)    Second-degree cystocele  Plan:   Have patient follow up in two weeks for pessary fitting. UA and urine culture ordered.   Debbra Riding PA-S Herold Harms, MD   I have seen, interviewed, and examined the patient in conjunction with the [facility].A. student and affirm the diagnosis and management plan. Josetta Wigal A. Kaitlinn Iversen, MD, FACOG   Note: This dictation was prepared with Dragon dictation along with smaller phrase technology. Any transcriptional errors that result from this process are unintentional.

## 2016-03-12 NOTE — Patient Instructions (Signed)
1. Urinalysis and culture is obtained today 2. Return in 2 weeks for a pessary fitting

## 2016-03-15 LAB — URINE CULTURE

## 2016-03-18 ENCOUNTER — Telehealth: Payer: Self-pay

## 2016-03-18 MED ORDER — NITROFURANTOIN MONOHYD MACRO 100 MG PO CAPS: 100.0000 mg | ORAL_CAPSULE | Freq: Two times a day (BID) | ORAL | 0 refills | Status: DC

## 2016-03-18 NOTE — Telephone Encounter (Signed)
Lmtrc. Macorbid erx.

## 2016-03-18 NOTE — Telephone Encounter (Signed)
-----   Message from Herold HarmsMartin A Defrancesco, MD sent at 03/18/2016  2:05 PM EDT ----- Please notify - Abnormal Labs UTI suspected;: Macrobid twice a day for 7 days

## 2016-04-04 ENCOUNTER — Encounter: Payer: Medicare Other | Admitting: Obstetrics and Gynecology

## 2016-04-10 ENCOUNTER — Encounter: Payer: Self-pay | Admitting: Obstetrics and Gynecology

## 2016-04-10 ENCOUNTER — Ambulatory Visit (INDEPENDENT_AMBULATORY_CARE_PROVIDER_SITE_OTHER): Payer: Medicare Other | Admitting: Obstetrics and Gynecology

## 2016-04-10 VITALS — BP 135/75 | HR 68 | Ht 59.0 in | Wt 135.9 lb

## 2016-04-10 DIAGNOSIS — N8111 Cystocele, midline: Secondary | ICD-10-CM

## 2016-04-10 DIAGNOSIS — N393 Stress incontinence (female) (male): Secondary | ICD-10-CM | POA: Diagnosis not present

## 2016-04-10 DIAGNOSIS — N952 Postmenopausal atrophic vaginitis: Secondary | ICD-10-CM | POA: Diagnosis not present

## 2016-04-10 DIAGNOSIS — N390 Urinary tract infection, site not specified: Secondary | ICD-10-CM | POA: Diagnosis not present

## 2016-04-10 DIAGNOSIS — R32 Unspecified urinary incontinence: Secondary | ICD-10-CM | POA: Diagnosis not present

## 2016-04-10 LAB — POCT URINALYSIS DIPSTICK
BILIRUBIN UA: NEGATIVE
Glucose, UA: NEGATIVE
Ketones, UA: NEGATIVE
LEUKOCYTES UA: NEGATIVE
NITRITE UA: NEGATIVE
PROTEIN UA: NEGATIVE
SPEC GRAV UA: 1.01
Urobilinogen, UA: NEGATIVE
pH, UA: 7

## 2016-04-10 MED ORDER — ESTROGENS, CONJUGATED 0.625 MG/GM VA CREA: TOPICAL_CREAM | VAGINAL | 1 refills | Status: DC

## 2016-04-10 NOTE — Progress Notes (Signed)
Chief complaint: 1. Pessary fitting 2. Urinary incontinence 3. Status post UTI treatment  Patient presents for pessary trial. She states that incontinence symptoms are slightly improved since being treated for UTI.  Past medical history, past surgical history, problem list, medications, and allergies are reviewed  OBJECTIVE: BP 135/75   Pulse 68   Ht 4\' 11"  (1.499 m)   Wt 135 lb 14.4 oz (61.6 kg)   BMI 27.45 kg/m  ABDOMEN: Soft, non distended; Non tender.  No Organomegaly. PELVIC:             External Genitalia: Normal             BUS: Normal; Q-tip demonstrates 45 rotation with Valsalva             Vagina: cystocele grade 2, mild atrophy; good apical support; no significant rectocele             Cervix: Surgically absent             Uterus: Surgically absent             Adnexa: Normal             RV: Normal sphincter tone; no rectal masses             Bladder: Nontender  PROCEDURE: Pessary trial  Incontinence dish-not tolerated  Ring with support-not tolerated  #2 cube-unable to insert  64 mm gel horn-not tolerated, expelled   ASSESSMENT: 1. UTI, treated 2. Urinary incontinence, persisting, diminished in intensity 3. Second-degree cystocele 4. Vaginal atrophy 5. Failed pessary trial  PLAN: 1. Premarin cream 1/2 g intravaginal twice weekly 2. Physical therapy referral for incontinence management 3. Return in 2 months for follow-up  A total of 25 minutes were spent face-to-face with the patient during this encounter and over half of that time involved counseling and coordination of care.  Herold HarmsMartin A Defrancesco, MD  Note: This dictation was prepared with Dragon dictation along with smaller phrase technology. Any transcriptional errors that result from this process are unintentional.

## 2016-04-10 NOTE — Patient Instructions (Signed)
1. Begin Premarin cream intravaginal 1 g twice a week 2. Physical therapy consultation for incontinence management 3. Return in 2 months for follow-up

## 2016-04-14 LAB — URINE CULTURE

## 2016-04-15 ENCOUNTER — Telehealth: Payer: Self-pay

## 2016-04-15 MED ORDER — NITROFURANTOIN MONOHYD MACRO 100 MG PO CAPS: 100.0000 mg | ORAL_CAPSULE | Freq: Two times a day (BID) | ORAL | 0 refills | Status: DC

## 2016-04-15 NOTE — Telephone Encounter (Signed)
-----   Message from Herold HarmsMartin A Defrancesco, MD sent at 04/15/2016  1:48 PM EDT ----- Please notify - Abnormal Labs Please call in Macrobid twice a day for 7 days

## 2016-04-15 NOTE — Telephone Encounter (Signed)
Pt aware. Med erx. 

## 2016-06-11 ENCOUNTER — Encounter: Payer: Medicare Other | Admitting: Obstetrics and Gynecology

## 2016-06-14 ENCOUNTER — Ambulatory Visit: Payer: Medicare Other | Attending: Obstetrics and Gynecology | Admitting: Physical Therapy

## 2016-06-14 ENCOUNTER — Encounter: Payer: Self-pay | Admitting: Physical Therapy

## 2016-06-14 DIAGNOSIS — M6281 Muscle weakness (generalized): Secondary | ICD-10-CM | POA: Insufficient documentation

## 2016-06-14 DIAGNOSIS — R278 Other lack of coordination: Secondary | ICD-10-CM | POA: Insufficient documentation

## 2016-06-14 DIAGNOSIS — R2689 Other abnormalities of gait and mobility: Secondary | ICD-10-CM | POA: Diagnosis present

## 2016-06-14 NOTE — Therapy (Addendum)
[facility] MAIN [facility] [address] [place], [place], [address] Phone: [phone]   Fax:  [phone]  Physical Therapy Evaluation  Patient Details  Name: Janett Billownn B Perlman MRN: [id number] Date of Birth: 09/16/1940 Referring Provider: Beatris Sie Francesco  Encounter Date: 06/14/2016      PT End of Session - 06/14/16 0915    Visit Number 1   Number of Visits 12   Date for PT Re-Evaluation 09/06/16   Authorization Type g code   PT Start Time 0905   PT Stop Time 1000   PT Time Calculation (min) 55 min   Activity Tolerance Patient tolerated treatment well;No increased pain      Past Medical History:  Diagnosis Date  . Chiari malformation    hx with cervical syringomyelia, s/p surgery at [facility] 10-12 years ago  . Depression   . Fibrocystic breast disease   . HTN (hypertension)   . Hyperparathyroidism    s/p parathyroidectomy in 9/10  . Normal echocardiogram    EF 50-55%, mild LVH, trivial AI, no other valvular disease  . RBBB (right bundle branch block with left anterior fascicular block)   . Recurrent UTI    and urinary incontinence    Past Surgical History:  Procedure Laterality Date  . ABDOMINAL HYSTERECTOMY    . chiari malformation    . hysterectomy (other)    . leg vein    . parathyroid surgery      There were no vitals filed for this visit.       Subjective Assessment - 06/14/16 0916    Subjective Urinary incontinence: Pt reports she has not been able to control her bladder for the past 10 years. Pt saw her urologist 2 months ago and was put on antibiotics 2x to resolve her UTI. Pt reports when she had her UTI, she could not control her bladder at all. Now she has regained l more control of her bladder. Pt is due for a f/u appt in Jan. Pt currently does not have feel when she has leaking and therefore, she always wears a pads. Pt  stated she does not keep count of her pads.  Denied leakage at night. Nocturia 1 x / night.  Pt  reported she notices when she is stressed, her urinary leakage is worst. Pt also reported she falls asleep during the day and has been told by a MD (she can not recall the name)  to get a sleep study but pt declined.    Pertinent History abdominal hysterectomy, cardiac conditions, Daily fluid intake: water (she is unsure of the amount but knows she is not drinking enough), no soda, 3-4  cups of coffee). 1 vaginal delivery. Denied LBP. Pt states she does not exercise and prefers to sit and watch TV all day. Pt states a barrier to exercising is that she is lazy.    Patient Stated Goals to get her mm stronger, and improve balance            [facility] PT Assessment - 06/14/16 2202      Assessment   Medical Diagnosis Urinary Incontinence    Referring Provider Beatris Sie Francesco     Precautions   Precautions None     Restrictions   Weight Bearing Restrictions No     Prior Function   Level of Independence Independent     Observation/Other Assessments   Observations slumped posture   Other Surveys  --  PDFI   %  Coordination   Gross Motor Movements are Fluid and Coordinated --  chest breathing   Fine Motor Movements are Fluid and Coordinated --  abdominal straining w/ cue for bowel movement     Single Leg Stance   Comments R SLS 4 sec, L 5 sec     Sit to Stand   Comments difficulty, relied on UE support      Posture/Postural Control   Posture Comments poor stability     ROM / Strength   AROM / PROM / Strength --  limited rotation (increased thoracic kyphosis)      Palpation   Spinal mobility increased thoracic kyphosis   Palpation comment abdominal scar restrictions                   [facility] Adult PT Treatment/Exercise - 06/14/16 2202      Therapeutic Activites    Therapeutic Activities --  see pt instructions                PT Education - 06/14/16 2206    Education provided Yes   Education Details POC, anatomy, physiology, goals, HEP   Person(s)  Educated Patient   Methods Explanation;Demonstration;Tactile cues;Verbal cues;Handout   Comprehension Returned demonstration;Verbalized understanding             PT Long Term Goals - 06/14/16 2212      PT LONG TERM GOAL #1   Title Pt will decrease her PFDI score from  % to  < % in order to restore pelvic floor function   Time 12   Period Weeks   Status New     PT LONG TERM GOAL #2   Title Pt will demo IND with HEP   Time 12   Period Weeks   Status New     PT LONG TERM GOAL #3   Title Pt will decreased abdominal scar restrictions and improved deep core coordination without cuing when performing deep core level 2-3 for 3 reps in order to improve urinary dysfunction      Time 12   Period Weeks   Status New             Plan - 06/14/16 2207    Clinical Impression Statement Pt is a 75 yo female who c/o urinary incontinence which impacts her QOL, ADLs, and sleep quality. Pt's clinical presentations include dyscoordination of pelvic floor mm, abdominal scar restrictions, thoracic kyphosis, generalized weakness, and poor balance. Pt stated she has been recommended to perform a sleep study but she has not f/u.  PT urged her to f/u with her MD to undergo sleep study because pt states she is not sleeping well and she falls asleep easily during the day. PT spent a large portion of today's session providing motivational interviewing and educating the importance of proper water intake, exercise, and sleep for health and well-being in order to promote pt's self-efficacy.  PT will f/u with referring provider on sleep study order.    Rehab Potential Fair   Clinical Impairments Affecting Rehab Potential abdominal hysterectomy, cardiac conditions, high intake bladder irritations/low intake of water, lack of motivation to exercise, depression    Frequency 1 x 12 weeks    PT Treatment/Interventions ADLs/Self Care Home Management; Gait training; Stair training; Electrical Stimulation;  Neuromuscular re-education; Functional mobility training; Patient/family education; Scar mobilization; Therapeutic activities; Manual techniques; Taping; Passive range of motion; Moist Heat     Patient will benefit from skilled therapeutic intervention in order to improve the following  deficits and impairments:  Improper body mechanics, Increased fascial restricitons, Postural dysfunction, Decreased scar mobility, Decreased range of motion, Decreased endurance, Decreased activity tolerance, Decreased safety awareness, Decreased strength, Decreased mobility, Decreased coordination  Visit Diagnosis: Other lack of coordination  Other abnormalities of gait and mobility  Muscle weakness (generalized)      G-Codes - 2016-06-21 Nov 13, 2216    Functional Assessment Tool Used clinical judgement and PFDI   Functional Limitation Mobility: Walking and moving around   Mobility: Walking and Moving Around Current Status [phone]) At least 40 percent but less than 60 percent impaired, limited or restricted   Mobility: Walking and Moving Around Goal Status [phone]) At least 20 percent but less than 40 percent impaired, limited or restricted       Problem List Patient Active Problem List   Diagnosis Date Noted  . Cystocele, midline 04/10/2016  . Vaginal atrophy 04/10/2016  . SUI (stress urinary incontinence, female) 04/10/2016  . Urinary incontinence in female 04/10/2016  . Recurrent UTI 04/10/2016  . Tachycardia 06/11/2011  . Essential hypertension 04/25/2009  . ELECTROCARDIOGRAM, ABNORMAL 04/25/2009    Mariane Masters ,PT, DPT, E-RYT  06/21/16, 10:19 PM  [facility] MAIN [facility] SERVICES [address] [place], [place], [address] Phone: [phone]   Fax:  [phone]  Name: CAROLL WEINHEIMER MRN: [id number] Date of Birth: [birth year]

## 2016-06-14 NOTE — Patient Instructions (Addendum)
     Currently, you drink 3-4 cups of coffe per day, little water  Try to restore bladder health by decrease coffee by one cup and then drink 1 cup of water daily  Then after 3 days, increase your water to 2 cups Next week, increase water to 3 cups and you will have a ratio of 3 cups of coffee, and then 3 cups of water  The following week, decrease to 2 cups of coffee per day, keep 3 cups of water  The following week, decrease coffee to 1 cup per day and keep 3 cups of water  ______________________   Follow up with your doctor for the sleep study he recommended you to get   ______________________  During TV commercials, perform 5 mini squats behind the chair (3x day) and walk 5 min around (1 x day)

## 2016-06-21 ENCOUNTER — Ambulatory Visit: Payer: Medicare Other | Admitting: Physical Therapy

## 2016-06-27 ENCOUNTER — Telehealth: Payer: Self-pay | Admitting: Physical Therapy

## 2016-06-27 NOTE — Addendum Note (Signed)
Addended by: Mariane MastersYEUNG, SHIN-YIING on: 06/27/2016 01:50 PM   Modules accepted: Orders

## 2016-06-27 NOTE — Telephone Encounter (Signed)
PT phoned the office of Dr. Harland GermanSparks,pt's PCP, informing the receptionist that pt would benefit from f/u with a sleep study because pt reported she had poor sleep and had been advised by another provider in the past to undergo a sleep study. Receptionist stated she will contact the pt to schedule a sleep study at a clinic of her preference.

## 2016-07-04 ENCOUNTER — Encounter: Payer: Medicare Other | Admitting: Obstetrics and Gynecology

## 2016-07-05 ENCOUNTER — Ambulatory Visit: Payer: Medicare Other | Attending: Obstetrics and Gynecology | Admitting: Physical Therapy

## 2016-07-05 DIAGNOSIS — M6281 Muscle weakness (generalized): Secondary | ICD-10-CM | POA: Diagnosis present

## 2016-07-05 DIAGNOSIS — R2689 Other abnormalities of gait and mobility: Secondary | ICD-10-CM | POA: Insufficient documentation

## 2016-07-05 DIAGNOSIS — R278 Other lack of coordination: Secondary | ICD-10-CM | POA: Insufficient documentation

## 2016-07-05 NOTE — Therapy (Signed)
[facility] MAIN [facility] SERVICES [address] [place], [place], [address] Phone: [phone]   Fax:  [phone]  Physical Therapy Treatment  Patient Details  Name: SATOYA FEELEY MRN: [id number] Date of Birth: 1941-01-29 Referring Provider: Beatris Si  Encounter Date: 07/05/2016      PT End of Session - 07/05/16 1034    Visit Number 2   Number of Visits 12   Date for PT Re-Evaluation 09/06/16   Authorization Type g code   PT Start Time 1020   PT Stop Time 1105   PT Time Calculation (min) 45 min   Activity Tolerance Patient tolerated treatment well;No increased pain      Past Medical History:  Diagnosis Date  . Chiari malformation    hx with cervical syringomyelia, s/p surgery at [facility] 10-12 years ago  . Depression   . Fibrocystic breast disease   . HTN (hypertension)   . Hyperparathyroidism    s/p parathyroidectomy in 9/10  . Normal echocardiogram    EF 50-55%, mild LVH, trivial AI, no other valvular disease  . RBBB (right bundle branch block with left anterior fascicular block)   . Recurrent UTI    and urinary incontinence    Past Surgical History:  Procedure Laterality Date  . ABDOMINAL HYSTERECTOMY    . chiari malformation    . hysterectomy (other)    . leg vein    . parathyroid surgery      There were no vitals filed for this visit.      Subjective Assessment - 07/05/16 1029    Subjective Pt reported she has been performing her squats. Pt reported she heard from her MD and she will be seeing him on 1/19 before issung a sleep study.    Pertinent History abdominal hysterectomy, cardiac conditions, Daily fluid intake: water (she is unsure of the amount but knows she is not drinking enough), no soda, 3-4  cups of coffee). 1 vaginal delivery. Denied LBP. Pt states she does not exercise and prefers to sit and watch TV all day. Pt states a barrier to exercising is that she is lazy.    Patient Stated Goals to get her mm  stronger, and improve balance                      Pelvic Floor Special Questions - 07/05/16 1910    Pelvic Floor Internal Exam pt consented verbally without contraindications   Exam Type Vaginal   Strength Flicker   Strength # of reps 20   Strength # of seconds 1           [facility] Adult PT Treatment/Exercise - 07/05/16 1910      Therapeutic Activites    Therapeutic Activities --  see pt instructions     Manual Therapy   Manual therapy comments thiele massage to faciliate pelvic floor activation                 PT Education - 07/05/16 1911    Education provided Yes   Education Details HEP   Person(s) Educated Patient   Methods Explanation;Demonstration;Tactile cues;Verbal cues;Handout   Comprehension Returned demonstration;Verbalized understanding             PT Long Term Goals - 07/05/16 1135      PT LONG TERM GOAL #1   Title Pt will decrease her PFDI score from  44% to  <39 % in order to restore pelvic floor function  Time 12   Period Weeks   Status On-going     PT LONG TERM GOAL #2   Title Pt will demo IND with HEP   Time 12   Period Weeks   Status On-going     PT LONG TERM GOAL #3   Title Pt will decreased abdominal scar restrictions and improved deep core coordination without cuing when performing deep core level 2-3 for 3 reps in order to improve urinary dysfunction   Time 12   Period Weeks   Status On-going               Plan - 07/05/16 1911    Clinical Impression Statement Pt showed pelvic floor weakness through an internal pelvic floor assessment but demo'd proper coordination following minor cues to minimzie abdominal straining. Initiated hip strengthening exercises today which pt dmeo'd correctly. Pt will continue to benefit from skilled PT. Time for the session was abbreviated due to pt needing to use the bathroom.    Rehab Potential Fair   Clinical Impairments Affecting Rehab Potential abdominal hysterectomy,  cardiac conditions, high intake bladder irritations/low intake of water, lack of motivation to exercise, depression   PT Frequency 1x / week   PT Duration 12 weeks   PT Treatment/Interventions ADLs/Self Care Home Management;Gait training;Stair training;Electrical Stimulation;Neuromuscular re-education;Functional mobility training;Patient/family education;Scar mobilization;Therapeutic activities;Manual techniques;Taping;Passive range of motion;Moist Heat   Consulted and Agree with Plan of Care Patient      Patient will benefit from skilled therapeutic intervention in order to improve the following deficits and impairments:  Improper body mechanics, Increased fascial restricitons, Postural dysfunction, Decreased scar mobility, Decreased range of motion, Decreased endurance, Decreased activity tolerance, Decreased safety awareness, Decreased strength, Decreased mobility, Decreased coordination  Visit Diagnosis: Other lack of coordination  Other abnormalities of gait and mobility  Muscle weakness (generalized)     Problem List Patient Active Problem List   Diagnosis Date Noted  . Cystocele, midline 04/10/2016  . Vaginal atrophy 04/10/2016  . SUI (stress urinary incontinence, female) 04/10/2016  . Urinary incontinence in female 04/10/2016  . Recurrent UTI 04/10/2016  . Tachycardia 06/11/2011  . Essential hypertension 04/25/2009  . ELECTROCARDIOGRAM, ABNORMAL 04/25/2009    Mariane MastersYeung,Shin Yiing ,PT, DPT, E-RYT  07/05/2016, 7:14 PM  [facility] MA[facility] [address] [place], [place], [address] Phone: [phone]   Fax:  [phone]  Name: Janett Billownn B Dauphinee MRN: [id number] Date of Birth: 01/01/1941

## 2016-07-05 NOTE — Patient Instructions (Signed)
PELVIC FLOOR / KEGEL EXERCISES   Pelvic floor/ Kegel exercises are used to strengthen the muscles in the base of your pelvis that are responsible for supporting your pelvic organs and preventing urine/feces leakage. Based on your therapist's recommendations, they can be performed while standing, sitting, or lying down. Imagine pelvic floor area as a diamond with pelvic landmarks: top =pubic bone, bottom tip=tailbone, sides=sitting bones (ischial tuberosities).    Make yourself aware of this muscle group by using these cues while coordinating your breath:  Inhale, feel pelvic floor diamond area lower like hammock towards your feet and ribcage/belly expanding. Pause. Let the exhale naturally and feel your belly sink, abdominal muscles hugging in around you and you may notice the pelvic diamond draws upward towards your head forming a umbrella shape. Give a squeeze during the exhalation like you are stopping the flow of urine. If you are squeezing the buttock muscles, try to give 50% less effort.   Common Errors:  Breath holding: If you are holding your breath, you may be bearing down against your bladder instead of pulling it up. If you belly bulges up while you are squeezing, you are holding your breath. Be sure to breathe gently in and out while exercising. Counting out loud may help you avoid holding your breath.  Accessory muscle use: You should not see or feel other muscle movement when performing pelvic floor exercises. When done properly, no one can tell that you are performing the exercises. Keep the buttocks, belly and inner thighs relaxed.  Overdoing it: Your muscles can fatigue and stop working for you if you over-exercise. You may actually leak more or feel soreness at the lower abdomen or rectum.  YOUR HOME EXERCISE PROGRAM     SHORT HOLDS: Position: on back   Inhale and then exhale. Then squeeze the muscle.  (Be sure to let belly sink in with exhales and not push  outward)  Perform 10 repetitions, 3  Times/day                      DECREASE DOWNWARD PRESSURE ON  YOUR PELVIC FLOOR, ABDOMINAL, LOW BACK MUSCLES       PRESERVE YOUR PELVIC HEALTH LONG-TERM   ** SQUEEZE pelvic floor BEFORE YOUR SNEEZE, COUGH, LAUGH   ** EXHALE BEFORE YOU RISE AGAINST GRAVITY (lifting, sit to stand, from squat to stand)   ** LOG ROLL OUT OF BED INSTEAD OF CRUNCH/SIT-UP   ______________  Sidestepping left and right along hallway  2 laps each side

## 2016-07-12 ENCOUNTER — Ambulatory Visit: Payer: Medicare Other | Admitting: Physical Therapy

## 2016-07-12 DIAGNOSIS — R2689 Other abnormalities of gait and mobility: Secondary | ICD-10-CM

## 2016-07-12 DIAGNOSIS — R278 Other lack of coordination: Secondary | ICD-10-CM

## 2016-07-12 DIAGNOSIS — M6281 Muscle weakness (generalized): Secondary | ICD-10-CM

## 2016-07-12 NOTE — Patient Instructions (Signed)
Try pelvic floor contractions with sounding out loud "ahhhh" after exhalation  10 x reps x 3 days   Remember when exhaling,  to allow belly to sink and soft (do not push it out)     ________________________  band under heels while laying on back w/ knees bent  "W" exercise  10 reps x 2 sets   Band is placed under feet, knees bent, feet are hip width apart Hold band with thumbs point out, keep upper arm and elbow touching the bed the whole time  - inhale and then exhale pull bands by bending elbows hands move in a "w"  (feel shoulder blades squeezing)

## 2016-07-12 NOTE — Therapy (Signed)
[facility]IN [facility] [address] [place], [place], [address] Phone: [phone]   Fax:  [phone]  Physical Therapy Treatment  Patient Details  Name: Lorraine Ferguson B Buchheit MRN: [id number] Date of Birth: 08/01/1940 Referring Provider: Beatris Sie Francesco  Encounter Date: 07/12/2016      PT End of Session - 07/12/16 1002    Visit Number 3   Number of Visits 12   Date for PT Re-Evaluation 09/06/16   Authorization Type g code   PT Start Time 0910   PT Stop Time 1000   PT Time Calculation (min) 50 min   Activity Tolerance Patient tolerated treatment well;No increased pain      Past Medical History:  Diagnosis Date  . Chiari malformation    hx with cervical syringomyelia, s/p surgery at [facility] 10-12 years ago  . Depression   . Fibrocystic breast disease   . HTN (hypertension)   . Hyperparathyroidism    s/p parathyroidectomy in 9/10  . Normal echocardiogram    EF 50-55%, mild LVH, trivial AI, no other valvular disease  . RBBB (right bundle branch block with left anterior fascicular block)   . Recurrent UTI    and urinary incontinence    Past Surgical History:  Procedure Laterality Date  . ABDOMINAL HYSTERECTOMY    . chiari malformation    . hysterectomy (other)    . leg vein    . parathyroid surgery      There were no vitals filed for this visit.      Subjective Assessment - 07/12/16 0918    Subjective Pt reports  she performed exercises every once in a while.    Pertinent History abdominal hysterectomy, cardiac conditions, Daily fluid intake: water (she is unsure of the amount but knows she is not drinking enough), no soda, 3-4  cups of coffee). 1 vaginal delivery. Denied LBP. Pt states she does not exercise and prefers to sit and watch TV all day. Pt states a barrier to exercising is that she is lazy.    Patient Stated Goals to get her mm stronger, and improve balance                      Pelvic Floor Special  Questions - 07/12/16 0939    Pelvic Floor Internal Exam pt consented verbally without contraindications   Exam Type Vaginal   Palpation initially demo'd abdominal straining with exhaling, excessive cuing for exhalation   Strength Flicker  required vocailization to facilitate 1/5 contract   Strength # of reps 20   Strength # of seconds 1           [facility] Adult PT Treatment/Exercise - 07/12/16 1123      Therapeutic Activites    Therapeutic Activities --  pt intructions      Neuro Re-ed    Neuro Re-ed Details  vocalizing to elicit pelvic floor contraction     Manual Therapy   Manual therapy comments thiele massage to faciliate pelvic floor activation                 PT Education - 07/12/16 0942    Education provided Yes   Education Details HEP   Person(s) Educated Patient   Methods Explanation;Demonstration;Tactile cues;Verbal cues   Comprehension Returned demonstration;Verbalized understanding;Verbal cues required;Tactile cues required             PT Long Term Goals - 07/05/16 1135      PT LONG TERM GOAL #  1   Title Pt will decrease her PFDI score from  44% to  <39 % in order to restore pelvic floor function   Time 12   Period Weeks   Status On-going     PT LONG TERM GOAL #2   Title Pt will demo IND with HEP   Time 12   Period Weeks   Status On-going     PT LONG TERM GOAL #3   Title Pt will decreased abdominal scar restrictions and improved deep core coordination without cuing when performing deep core level 2-3 for 3 reps in order to improve urinary dysfunction   Time 12   Period Weeks   Status On-going               Plan - 07/12/16 0944    Clinical Impression Statement Pt required excessive cuing for decreased abdominal straining with exhalation. Pt had difficulty eliciting a 1/5 grade contraction with verbal cues and accessory mm activation. Vocalizing of an "ahhh" sound with use of glottis diaphragm faciliated a 1/5 contraction  suceesfully and pt voiced awareness of abdominal mm activation.  Pt continues to report issues with memory and PT discussed her concern for memory, encouraged social interactions at [facility], encouraged to f/u with her MD who is aware of her concerns on memory and has advised her to undergo sleep study 1/19 to address her poor sleep. Explained the benefits of sleep for memory and increasing social interactions and less sedentary life. PT also educated her on compliance of PT to yield outcomes. Pt voiced agreement to come to next session and to attend events at the [facility]. Pt continues to benefit from skilled PT.    Rehab Potential Fair   Clinical Impairments Affecting Rehab Potential abdominal hysterectomy, cardiac conditions, high intake bladder irritations/low intake of water, lack of motivation to exercise, depression   PT Frequency 1x / week   PT Duration 12 weeks   PT Treatment/Interventions ADLs/Self Care Home Management;Gait training;Stair training;Electrical Stimulation;Neuromuscular re-education;Functional mobility training;Patient/family education;Scar mobilization;Therapeutic activities;Manual techniques;Taping;Passive range of motion;Moist Heat   Consulted and Agree with Plan of Care Patient      Patient will benefit from skilled therapeutic intervention in order to improve the following deficits and impairments:  Improper body mechanics, Increased fascial restricitons, Postural dysfunction, Decreased scar mobility, Decreased range of motion, Decreased endurance, Decreased activity tolerance, Decreased safety awareness, Decreased strength, Decreased mobility, Decreased coordination  Visit Diagnosis: Other lack of coordination  Other abnormalities of gait and mobility  Muscle weakness (generalized)     Problem List Patient Active Problem List   Diagnosis Date Noted  . Cystocele, midline 04/10/2016  . Vaginal atrophy 04/10/2016  . SUI (stress urinary incontinence,  female) 04/10/2016  . Urinary incontinence in female 04/10/2016  . Recurrent UTI 04/10/2016  . Tachycardia 06/11/2011  . Essential hypertension 04/25/2009  . ELECTROCARDIOGRAM, ABNORMAL 04/25/2009    Mariane Masters ,PT, DPT, E-RYT  07/12/2016, 11:25 AM  [facility] MAIN [facility] SERVICES [address] [place], [place], [address] Phone: [phone]   Fax:  [phone]  Name: KELVIN BURPEE MRN: [id number] Date of Birth: 1941-02-22

## 2016-07-19 ENCOUNTER — Encounter: Payer: Medicare Other | Admitting: Physical Therapy

## 2016-08-02 ENCOUNTER — Encounter: Payer: Medicare Other | Admitting: Physical Therapy

## 2016-08-16 ENCOUNTER — Encounter: Payer: Medicare Other | Admitting: Physical Therapy

## 2016-09-06 ENCOUNTER — Encounter: Payer: Medicare Other | Admitting: Physical Therapy

## 2016-11-02 DIAGNOSIS — R5382 Chronic fatigue, unspecified: Secondary | ICD-10-CM | POA: Insufficient documentation

## 2016-11-02 NOTE — Progress Notes (Signed)
Cardiology Office Note  Date:  11/05/2016   ID:  Lorraine Ferguson, DOB 1940-12-25, MRN [id number]  PCP:  Marguarite Arbour, MD   Chief Complaint  Patient presents with  . other    12 month follow up. Meds reviewed by the pt. verbally. "doing well."     HPI:  76 yo  woman with history of  HTN  hyperparathyroidism s/p parathyroidectomy 9/10,    ABN EKG with RBBB and left anterior fascicular block on her ECG chronic fatigue despite once daily Adderall.  Chiari I malformation (CMS-HCC)  presents for routine followup of her hypertension and tachycardia  In follow-up today she denies any new symptoms Tolerating her medication Blood pressure well controlled No regular exercise, legs are getting weaker Poor diet, gaining weight  No recent lipid panel available for review Previous Total chol 176  Tolerating her beta blocker, denies any tachycardia Husband takes care of a property, and their horse   EKG on today's visit shows normal sinus rhythm with rate 69 bpm, right bundle branch block, left anterior fascicular block  Other past medical history  Previously reported that  food was sticking when she swallows.  EGD and colonoscopy with Dr. Mechele Collin 04/08/2013.  She has no new complaints.    previous Total cholesterol 176, LDL 109   PMH:   has a past medical history of Chiari malformation; Depression; Fibrocystic breast disease; HTN (hypertension); Hyperparathyroidism; Normal echocardiogram; RBBB (right bundle branch block with left anterior fascicular block); and Recurrent UTI.  PSH:    Past Surgical History:  Procedure Laterality Date  . ABDOMINAL HYSTERECTOMY    . chiari malformation    . hysterectomy (other)    . leg vein    . parathyroid surgery      Current Outpatient Prescriptions  Medication Sig Dispense Refill  . Amphetamine-Dextroamphetamine (ADDERALL PO) Take 10 mg by mouth daily.     Marland Kitchen aspirin 325 MG tablet Take 325 mg by mouth daily.    .  bisoprolol-hydrochlorothiazide (ZIAC) 10-6.25 MG tablet Take 1 tablet by mouth daily.    . cyanocobalamin 100 MCG tablet Take 100 mcg by mouth daily.     No current facility-administered medications for this visit.     Allergies:   Levofloxacin   Social History:  The patient  reports that she has never smoked. She has never used smokeless tobacco. She reports that she does not drink alcohol or use drugs.   Family History:   family history includes Alzheimer's disease in her mother; Breast cancer in her mother; Heart failure in her father; Stroke in her mother.    Review of Systems: Review of Systems  Constitutional: Negative.   Respiratory: Negative.   Cardiovascular: Negative.   Gastrointestinal: Negative.   Musculoskeletal: Negative.   Neurological: Negative.   Psychiatric/Behavioral: Negative.   All other systems reviewed and are negative.    PHYSICAL EXAM: VS:  BP 140/62 (BP Location: Left Arm, Patient Position: Sitting, Cuff Size: Normal)   Pulse 65   Ht 4\' 8"  (1.422 m)   Wt 135 lb 4 oz (61.3 kg)   BMI 30.32 kg/m  , BMI Body mass index is 30.32 kg/m. GEN: Well nourished, well developed, in no acute distress  HEENT: normal  Neck: no JVD, carotid bruits, or masses Cardiac: RRR; 1+ LSB,  No rubs, or gallops,no edema  Respiratory:  clear to auscultation bilaterally, normal work of breathing GI: soft, nontender, nondistended, + BS MS: no deformity or atrophy  Skin: warm and  dry, no rash Neuro:  Strength and sensation are intact Psych: euthymic mood, full affect    Recent Labs: No results found for requested labs within last 8760 hours.    Lipid Panel Lab Results  Component Value Date   CHOL 195 05/09/2009   HDL 58 05/09/2009   LDLCALC 120 (H) 05/09/2009   TRIG 87 05/09/2009      Wt Readings from Last 3 Encounters:  11/05/16 135 lb 4 oz (61.3 kg)  04/10/16 135 lb 14.4 oz (61.6 kg)  03/12/16 136 lb 9.6 oz (62 kg)       ASSESSMENT AND  PLAN:  Essential hypertension - Plan: EKG 12-Lead Blood pressure is well controlled on today's visit. No changes made to the medications.  Abnormal EKG - Plan: EKG 12-Lead Right bundle branch block No further workup at this time  Chronic fatigue - Plan: EKG 12-Lead Recommended regular exercise program  Obesity Recommended dietary changes   Disposition:   F/U  12 months as needed   Orders Placed This Encounter  Procedures  . EKG 12-Lead     Signed, Dossie Arbourim Gollan, M.D., Ph.D. 11/05/2016  [facility], [place] [phone]

## 2016-11-05 ENCOUNTER — Encounter: Payer: Self-pay | Admitting: Cardiovascular Disease

## 2016-11-05 ENCOUNTER — Ambulatory Visit (INDEPENDENT_AMBULATORY_CARE_PROVIDER_SITE_OTHER): Payer: Medicare Other | Admitting: Cardiovascular Disease

## 2016-11-05 VITALS — BP 140/62 | HR 65 | Ht <= 58 in | Wt 135.2 lb

## 2016-11-05 DIAGNOSIS — Z6835 Body mass index (BMI) 35.0-35.9, adult: Secondary | ICD-10-CM | POA: Diagnosis not present

## 2016-11-05 DIAGNOSIS — R5382 Chronic fatigue, unspecified: Secondary | ICD-10-CM

## 2016-11-05 DIAGNOSIS — E6609 Other obesity due to excess calories: Secondary | ICD-10-CM

## 2016-11-05 DIAGNOSIS — R9431 Abnormal electrocardiogram [ECG] [EKG]: Secondary | ICD-10-CM | POA: Diagnosis not present

## 2016-11-05 DIAGNOSIS — I10 Essential (primary) hypertension: Secondary | ICD-10-CM | POA: Diagnosis not present

## 2016-11-05 NOTE — Patient Instructions (Signed)

## 2017-06-17 ENCOUNTER — Encounter: Payer: Self-pay | Admitting: Urology

## 2017-06-17 ENCOUNTER — Ambulatory Visit (INDEPENDENT_AMBULATORY_CARE_PROVIDER_SITE_OTHER): Payer: Medicare Other | Admitting: Urology

## 2017-06-17 VITALS — BP 159/82 | HR 65 | Ht <= 58 in | Wt 132.0 lb

## 2017-06-17 DIAGNOSIS — R32 Unspecified urinary incontinence: Secondary | ICD-10-CM | POA: Diagnosis not present

## 2017-06-17 DIAGNOSIS — N8111 Cystocele, midline: Secondary | ICD-10-CM

## 2017-06-17 DIAGNOSIS — N390 Urinary tract infection, site not specified: Secondary | ICD-10-CM

## 2017-06-17 DIAGNOSIS — N3946 Mixed incontinence: Secondary | ICD-10-CM

## 2017-06-17 LAB — BLADDER SCAN AMB NON-IMAGING

## 2017-06-17 MED ORDER — MIRABEGRON ER 25 MG PO TB24: 25.0000 mg | ORAL_TABLET | Freq: Every day | ORAL | 11 refills | Status: DC

## 2017-06-17 MED ORDER — ESTROGENS, CONJUGATED 0.625 MG/GM VA CREA: 1.0000 | TOPICAL_CREAM | Freq: Every day | VAGINAL | 12 refills | Status: DC

## 2017-06-17 NOTE — Progress Notes (Signed)
06/17/2017 12:58 PM   [name] 1940-10-17 [id number]  Referring provider: Marguarite Arbour, MD [address] [facility] [place], [place] [address]  Chief Complaint  Patient presents with  . Urinary Incontinence    New Patient    HPI: 76 year old female referred for further evaluation of recurrent urinary tract infections and urinary incontinence.   That she is somewhat of a difficult historian today but based on her history, it sounds like she has both leakage with movement, position changes, and Valsalva as well as occasional urinary urgency and large volume incontinence.  She reports that after being treated for UTIs, her symptoms improved mildly but never completely resolved.  This been going on for many years.  It has been slowly worsening over time.  She was seen and evaluated by urologist many years ago including Dr. Barnabas Lister.  It sounds like she has had urodynamics in the past.  She reports that she was started on a medication for overactive bladder by Dr. Judithann Sheen in the remote past which helped but then was never able to fill the prescription because the generic was not effective.  He cannot recall the name of this medication.  She wears several pads a day which are saturated.  This is quite bothersome for her and keeps her from doing the things that she would otherwise do including going out shopping.  She was fitted for a pessary for cystocele but it would not stay in.  She was also previously on premarin cream but stopped using this.    She has 2 documented urinary tract infection over the past year.  This was on 01/2017 and 10/2016 each time growing E. Coli.  She denies any dysuria or gross hematuria today.  No history of flank pain or kidney stones.  She has been seen and evaluated by physical therapy in January/ 2018 for pelvic floor.  She went for several visits but quit going.  She reports that she did not do her exercises when she was doing  exercises, she was doing a little bit better.  She also reports that her overall physical conditioning is poor.  She is losing strength in her lower extremities and is starting to have some falls.  PVR 0.  She was not able to void today during the visit.  PMH: Past Medical History:  Diagnosis Date  . Chiari malformation    hx with cervical syringomyelia, s/p surgery at [facility] 10-12 years ago  . Depression   . Fibrocystic breast disease   . HTN (hypertension)   . Hyperparathyroidism    s/p parathyroidectomy in 9/10  . Normal echocardiogram    EF 50-55%, mild LVH, trivial AI, no other valvular disease  . RBBB (right bundle branch block with left anterior fascicular block)   . Recurrent UTI    and urinary incontinence    Surgical History: Past Surgical History:  Procedure Laterality Date  . ABDOMINAL HYSTERECTOMY    . chiari malformation    . hysterectomy (other)    . leg vein    . parathyroid surgery      Home Medications:  Allergies as of 06/17/2017      Reactions   Levofloxacin    Caused arm pain.      Medication List        Accurate as of 06/17/17 12:58 PM. Always use your most recent med list.          ADDERALL PO Take 10 mg by mouth daily.  aspirin 325 MG tablet Take 325 mg by mouth daily.   bisoprolol-hydrochlorothiazide 10-6.25 MG tablet Commonly known as:  ZIAC Take 1 tablet by mouth daily.   conjugated estrogens vaginal cream Commonly known as:  PREMARIN Place 1 Applicatorful vaginally daily. Use pea sized amount M-W-Fr before bedtime   cyanocobalamin 100 MCG tablet Take 100 mcg by mouth daily.   mirabegron ER 25 MG Tb24 tablet Commonly known as:  MYRBETRIQ Take 1 tablet (25 mg total) by mouth daily.       Allergies:  Allergies  Allergen Reactions  . Levofloxacin     Caused arm pain.    Family History: Family History  Problem Relation Age of Onset  . Alzheimer's disease Mother   . Stroke Mother   . Breast cancer Mother   .  Heart failure Father   . Ovarian cancer Neg Hx   . Colon cancer Neg Hx   . Diabetes Neg Hx     Social History:  reports that  has never smoked. she has never used smokeless tobacco. She reports that she does not drink alcohol or use drugs.  ROS: UROLOGY Frequent Urination?: No Hard to postpone urination?: Yes Burning/pain with urination?: No Get up at night to urinate?: Yes Leakage of urine?: Yes Urine stream starts and stops?: No Trouble starting stream?: No Do you have to strain to urinate?: No Blood in urine?: No Urinary tract infection?: No Sexually transmitted disease?: No Injury to kidneys or bladder?: No Painful intercourse?: No Weak stream?: No Currently pregnant?: No Vaginal bleeding?: No Last menstrual period?: n  Gastrointestinal Nausea?: No Vomiting?: No Indigestion/heartburn?: No Diarrhea?: No Constipation?: No  Constitutional Fever: No Night sweats?: No Weight loss?: No Fatigue?: Yes  Skin Skin rash/lesions?: No Itching?: No  Eyes Blurred vision?: No Double vision?: No  Ears/Nose/Throat Sore throat?: No Sinus problems?: No  Hematologic/Lymphatic Swollen glands?: No Easy bruising?: No  Cardiovascular Leg swelling?: No Chest pain?: No  Respiratory Cough?: No Shortness of breath?: No  Endocrine Excessive thirst?: No  Musculoskeletal Back pain?: No Joint pain?: No  Neurological Headaches?: No Dizziness?: No  Psychologic Depression?: Yes Anxiety?: Yes  Physical Exam: BP (!) 159/82   Pulse 65   Ht 4\' 8"  (1.422 m)   Wt 132 lb (59.9 kg)   BMI 29.59 kg/m   Constitutional:  Alert and oriented, No acute distress. HEENT: Herman AT, moist mucus membranes.  Trachea midline, no masses. Cardiovascular: No clubbing, cyanosis, or edema. Respiratory: Normal respiratory effort, no increased work of breathing. GI: Abdomen is soft, nontender, nondistended, no abdominal masses.  Obese.   GU: No CVA tenderness.  Skin: No rashes, bruises  or suspicious lesions. Neurologic: Grossly intact, no focal deficits, moving all 4 extremities.  She is having word finding difficulties today which is limiting her ability to communicate fluidly. Psychiatric: Normal mood and affect.  Laboratory Data: Creatinine 0.85/2018  Urinalysis Urinalysis from 02/21/2017 and 11/13/2016 reviewed in care everywhere  Pertinent Imaging: Results for orders placed or performed in visit on 06/17/17  BLADDER SCAN AMB NON-IMAGING  Result Value Ref Range   Scan Result 0ml     Assessment & Plan:   1. Mixed urge and stress incontinence Based on her history, I suspect that she has both stress and urge incontinence She is not interested in surgical intervention I recommended resumption of Kegel activities, information on exercises given In addition to this, I think she would benefit from resuming topical estrogen cream  Samples of Myrbetriq 25 mg x 4  weeks given today, will try to treat overactivity symptoms - Urinalysis, Complete - BLADDER SCAN AMB NON-IMAGING  2. Cystocele, midline History of a symptomatic cystocele No evidence of incomplete bladder emptying as a contributing factor Not interested in surgical intervention Previously failed pessary Plan for pelvic exam next visit  3. Recurrent UTI 2 symptomatic UTIs for the past 12 months Unable to void today for UA/urine culture Resume Premarin cream as discussed No additional workup warranted at this time but may consider in future if frequency of infections increase  Return in about 4 weeks (around 07/15/2017) for pelvic exam/ catheted UA.  Vanna ScotlandAshley Silveria Botz, MD  [facility] [address], [address] [place], [place] [address] [phone]

## 2017-06-17 NOTE — Patient Instructions (Signed)
Your Home Program  General Guidelines for Pelvic Floor Exercise  Challenge your muscles to do more than they are used to doing. The quality of the exercise is more important that the number you perform.  Avoid straining, holding your breath or using buttock or leg muscles while you exercise the pelvic floor muscles.  Count out loud and continue breathing to avoid straining.  Relax your body and breathe during your exercises.  Coordinate your breathing with your pelvic floor contraction by blowing out or exhaling while you contract your pelvic floor muscles.   Concentrate on activating both the sphincters and levator ani muscles of the pelvic floor with each exercise.  Position for the Exercises  Start lying down with your knees bent and supported with pillows.  Once you've gained awareness and can feel the contractions you may perform the exercises either sitting or standing.  For example, you can do them while driving, working on the computer, or waiting in lines.  Quick Contractions  Repeat this exercise 10 times.  Do the exercise 3 times per day.  Rapidly contract your pelvic floor muscles and hold for 2 seconds relax for 2 seconds.  Try to do the contraction on breathing exhalation.  Endurance Contractions  Repeat this 10 times.  Do the exercise 3 times per day.  Pull your pelvic floor muscles up and in and hold for 10 seconds then relax for 30 seconds.  Count out loud while you are holding the contraction to make sure that you are breathing throughout the exercise and not straining.  Other Exercises/Instructions   2007, [employer] Doc.37

## 2017-07-15 ENCOUNTER — Encounter: Payer: Self-pay | Admitting: Urology

## 2017-07-15 ENCOUNTER — Ambulatory Visit (INDEPENDENT_AMBULATORY_CARE_PROVIDER_SITE_OTHER): Payer: Medicare Other | Admitting: Urology

## 2017-07-15 VITALS — BP 136/75 | HR 66 | Ht <= 58 in | Wt 130.0 lb

## 2017-07-15 DIAGNOSIS — N8111 Cystocele, midline: Secondary | ICD-10-CM

## 2017-07-15 DIAGNOSIS — N390 Urinary tract infection, site not specified: Secondary | ICD-10-CM | POA: Diagnosis not present

## 2017-07-15 DIAGNOSIS — N3946 Mixed incontinence: Secondary | ICD-10-CM | POA: Diagnosis not present

## 2017-07-15 LAB — URINALYSIS, COMPLETE
BILIRUBIN UA: NEGATIVE
GLUCOSE, UA: NEGATIVE
Leukocytes, UA: NEGATIVE
Nitrite, UA: NEGATIVE
PH UA: 7 (ref 5.0–7.5)
SPEC GRAV UA: 1.02 (ref 1.005–1.030)
UUROB: 2 mg/dL — AB (ref 0.2–1.0)

## 2017-07-15 LAB — MICROSCOPIC EXAMINATION

## 2017-07-15 NOTE — Progress Notes (Signed)
In and Out Catheterization  Patient is present today for a I & O catheterization due to urinary incontinence. Patient was cleaned and prepped in a sterile fashion with betadine and Lidocaine 2% jelly was instilled into the urethra.  A 14FR cath was inserted no complications were noted , 30ml of urine return was noted, urine was yellow in color. A clean urine sample was collected for UA and culture. Bladder was drained  And catheter was removed with out difficulty.    Preformed by: Vanna ScotlandAshley Brandon, MD and Eligha BridegroomSarah Havah Ammon, CMA

## 2017-07-15 NOTE — Progress Notes (Signed)
07/15/2017 5:01 PM   Lorraine BillowAnn B Satter 08/09/1940 [id number]  Referring provider: Marguarite ArbourSparks, Jeffrey D, MD [address] [facility] [place], [place] [id number]  Chief Complaint  Patient presents with  . Urinary Incontinence    HPI: 77 year old female with a history of recurrent urinary tract infections and mixed urinary incontinence returns today for routine follow-up and pelvic exam.  Since last visit, she was started on topical estrogen cream as well as given samples of Myrbetriq 25 mg for stress urinary incontinence.  She reports that overall, her symptoms are improved.  She has decreased urinary frequency and urgency.  She continues to have her baseline stress urinary incontinence.  She is pleased with the overall result.  She like to continue this medication.  She denies any dysuria or gross hematuria today.  No fevers or chills.  She does not feel that she has a UTI today.  Please see previous notes for details.   PMH: Past Medical History:  Diagnosis Date  . Chiari malformation    hx with cervical syringomyelia, s/p surgery at [facility] 10-12 years ago  . Depression   . Fibrocystic breast disease   . HTN (hypertension)   . Hyperparathyroidism    s/p parathyroidectomy in 9/10  . Normal echocardiogram    EF 50-55%, mild LVH, trivial AI, no other valvular disease  . RBBB (right bundle branch block with left anterior fascicular block)   . Recurrent UTI    and urinary incontinence    Surgical History: Past Surgical History:  Procedure Laterality Date  . ABDOMINAL HYSTERECTOMY    . chiari malformation    . hysterectomy (other)    . leg vein    . parathyroid surgery      Home Medications:  Allergies as of 07/15/2017      Reactions   Levofloxacin    Caused arm pain.      Medication List        Accurate as of 07/15/17 11:59 PM. Always use your most recent med list.          ADDERALL PO Take 10 mg by mouth daily.   aspirin 325 MG tablet Take 325 mg  by mouth daily.   bisoprolol-hydrochlorothiazide 10-6.25 MG tablet Commonly known as:  ZIAC Take 1 tablet by mouth daily.   conjugated estrogens vaginal cream Commonly known as:  PREMARIN Place 1 Applicatorful vaginally daily. Use pea sized amount M-W-Fr before bedtime   cyanocobalamin 100 MCG tablet Take 100 mcg by mouth daily.   mirabegron ER 25 MG Tb24 tablet Commonly known as:  MYRBETRIQ Take 1 tablet (25 mg total) by mouth daily.       Allergies:  Allergies  Allergen Reactions  . Levofloxacin     Caused arm pain.    Family History: Family History  Problem Relation Age of Onset  . Alzheimer's disease Mother   . Stroke Mother   . Breast cancer Mother   . Heart failure Father   . Ovarian cancer Neg Hx   . Colon cancer Neg Hx   . Diabetes Neg Hx     Social History:  reports that  has never smoked. she has never used smokeless tobacco. She reports that she does not drink alcohol or use drugs.  ROS: UROLOGY Frequent Urination?: No Hard to postpone urination?: No Burning/pain with urination?: No Get up at night to urinate?: Yes Leakage of urine?: Yes Urine stream starts and stops?: No Trouble starting stream?: No Do you have to  strain to urinate?: No Blood in urine?: No Urinary tract infection?: No Sexually transmitted disease?: No Injury to kidneys or bladder?: No Painful intercourse?: No Weak stream?: No Currently pregnant?: No Vaginal bleeding?: No Last menstrual period?: n  Gastrointestinal Nausea?: No Vomiting?: No Indigestion/heartburn?: No Diarrhea?: No Constipation?: No  Constitutional Fever: No Night sweats?: No Weight loss?: No Fatigue?: No  Skin Skin rash/lesions?: No Itching?: No  Eyes Blurred vision?: No Double vision?: No  Ears/Nose/Throat Sore throat?: No Sinus problems?: No  Hematologic/Lymphatic Swollen glands?: No Easy bruising?: No  Cardiovascular Leg swelling?: No Chest pain?: No  Respiratory Cough?:  No Shortness of breath?: No  Endocrine Excessive thirst?: No  Musculoskeletal Back pain?: No Joint pain?: No  Neurological Headaches?: No Dizziness?: No  Psychologic Depression?: No Anxiety?: No  Physical Exam: BP 136/75   Pulse 66   Ht 4\' 8"  (1.422 m)   Wt 130 lb (59 kg)   BMI 29.15 kg/m   Constitutional:  Alert and oriented, No acute distress. HEENT: [place] AT, moist mucus membranes.  Trachea midline, no masses. Cardiovascular: No clubbing, cyanosis, or edema. Respiratory: Normal respiratory effort, no increased work of breathing. GI: Abdomen is soft, nontender, nondistended, no abdominal masses Pelvic: Normal external genitalia.  Atrophic vaginitis appreciated.  Stage II cystocele to vaginal introitus with Valsalva.  Small urethral caruncle appreciated.  Minimal hypermobility with no demonstrable stress incontinence. Skin: No rashes, bruises or suspicious lesions. Neurologic: Grossly intact, no focal deficits, moving all 4 extremities. Psychiatric: Normal mood and affect.  Catheterized for PVR today of 20 cc, clear straw-colored urine.   Laboratory Data: Lab Results  Component Value Date   WBC 5.8 02/27/2009   HGB 14.0 02/27/2009   HCT 41.5 02/27/2009   MCV 89.4 02/27/2009   PLT 242 02/27/2009    Lab Results  Component Value Date   CREATININE 0.84 10/12/2010   Urinalysis Results for orders placed or performed in visit on 07/15/17  CULTURE, URINE COMPREHENSIVE  Result Value Ref Range   Urine Culture, Comprehensive Final report (A)    Organism ID, Bacteria Enterococcus faecalis (A)    Organism ID, Bacteria Escherichia coli (A)    ANTIMICROBIAL SUSCEPTIBILITY Comment   Microscopic Examination  Result Value Ref Range   WBC, UA 0-5 0 - 5 /hpf   RBC, UA 3-10 (A) 0 - 2 /hpf   Epithelial Cells (non renal) 0-10 0 - 10 /hpf   Mucus, UA Present (A) Not Estab.   Bacteria, UA Moderate (A) None seen/Few  Urinalysis, Complete  Result Value Ref Range   Specific  Gravity, UA 1.020 1.005 - 1.030   pH, UA 7.0 5.0 - 7.5   Color, UA Yellow Yellow   Appearance Ur Clear Clear   Leukocytes, UA Negative Negative   Protein, UA 1+ (A) Negative/Trace   Glucose, UA Negative Negative   Ketones, UA Trace (A) Negative   RBC, UA 1+ (A) Negative   Bilirubin, UA Negative Negative   Urobilinogen, Ur 2.0 (H) 0.2 - 1.0 mg/dL   Nitrite, UA Negative Negative   Microscopic Examination See below:     Pertinent Imaging: N/a  Assessment & Plan:    1. Mixed stress and urge urinary incontinence Symptoms improved with Mybretriq 25 mg Continue this medication - Urinalysis, Complete  2. Cystocele, midline History of a symptomatic cystocele No evidence of incomplete bladder emptying as a contributing factor Not interested in surgical intervention Previously failed pessary Not interested in further therapy  3. Recurrent UTI UCx again today  given presence of bacteria and small blood to r/o infection Otherwise asymptomatic Microscopic blood today in setting of possible UTI- if UCx+ no need for additional work up but will recheck in the future  4. Atrophic vaginitis Estrogen cream as above    Return in about 1 year (around 07/15/2018) for PVR.  Vanna Scotland, MD  [facility] [address], [address] [place], [place] [address] [phone]

## 2017-07-18 ENCOUNTER — Telehealth: Payer: Self-pay

## 2017-07-18 LAB — CULTURE, URINE COMPREHENSIVE

## 2017-07-18 MED ORDER — NITROFURANTOIN MONOHYD MACRO 100 MG PO CAPS: 100.0000 mg | ORAL_CAPSULE | Freq: Two times a day (BID) | ORAL | 0 refills | Status: DC

## 2017-07-18 NOTE — Telephone Encounter (Signed)
-----   Message from Vanna ScotlandAshley Brandon, MD sent at 07/18/2017 12:38 PM EST ----- This patient came into the office but was otherwise asymptomatic and doing well.  Her urine was somewhat suspicious.  She did not have grown bacteria.  I believe this is a bacterial colonization rather than true infection.  Please contact her and find out if she is having any symptoms of dysuria or worsening incontinence.  If she is doing well, we will not treat.  If not, please treat with Macrobid twice daily times 10 days.  Vanna ScotlandAshley Brandon, MD

## 2017-07-18 NOTE — Telephone Encounter (Signed)
Spoke with pt who stated she has started burning on urination and last night her incontinence was the worst it has been in a while. Therefore, sent macrobid to pt pharmacy. Pt voiced understanding.

## 2018-07-14 ENCOUNTER — Ambulatory Visit: Payer: Medicare Other | Admitting: Urology

## 2018-08-13 ENCOUNTER — Ambulatory Visit: Payer: Medicare Other | Admitting: Urology

## 2018-11-02 ENCOUNTER — Telehealth: Payer: Self-pay

## 2018-11-02 NOTE — Telephone Encounter (Signed)
Spoke with patient to schedule overdue F/U with Dr. Mariah Milling. Offered telephone or video visit due to current clinic policies related to COVID 19 precautions. Patient declined. She states she is not having any problems at this time and would rather wait until she can be seen in the office. Patient informed that recall will be placed for 01/30/2019.

## 2018-12-20 ENCOUNTER — Inpatient Hospital Stay
Admission: EM | Admit: 2018-12-20 | Discharge: 2018-12-24 | DRG: 329 | Disposition: A | Discharge status: Home / Self Care | Payer: Medicare Other | Attending: General Surgery | Admitting: General Surgery

## 2018-12-20 ENCOUNTER — Inpatient Hospital Stay: Payer: Medicare Other | Admitting: Anesthesiology

## 2018-12-20 ENCOUNTER — Emergency Department: Payer: Medicare Other

## 2018-12-20 ENCOUNTER — Telehealth: Payer: Self-pay | Admitting: General Surgery

## 2018-12-20 ENCOUNTER — Encounter
Admission: EM | Disposition: A | Discharge status: Home / Self Care | Payer: Self-pay | Source: Home / Self Care | Attending: General Surgery

## 2018-12-20 ENCOUNTER — Other Ambulatory Visit: Payer: Self-pay

## 2018-12-20 ENCOUNTER — Encounter: Payer: Self-pay | Admitting: *Deleted

## 2018-12-20 DIAGNOSIS — Z803 Family history of malignant neoplasm of breast: Secondary | ICD-10-CM

## 2018-12-20 DIAGNOSIS — Z881 Allergy status to other antibiotic agents status: Secondary | ICD-10-CM | POA: Diagnosis not present

## 2018-12-20 DIAGNOSIS — I4510 Unspecified right bundle-branch block: Secondary | ICD-10-CM | POA: Diagnosis present

## 2018-12-20 DIAGNOSIS — Z820 Family history of epilepsy and other diseases of the nervous system: Secondary | ICD-10-CM | POA: Diagnosis not present

## 2018-12-20 DIAGNOSIS — Z1159 Encounter for screening for other viral diseases: Secondary | ICD-10-CM

## 2018-12-20 DIAGNOSIS — Z823 Family history of stroke: Secondary | ICD-10-CM

## 2018-12-20 DIAGNOSIS — K4030 Unilateral inguinal hernia, with obstruction, without gangrene, not specified as recurrent: Secondary | ICD-10-CM | POA: Diagnosis present

## 2018-12-20 DIAGNOSIS — N6019 Diffuse cystic mastopathy of unspecified breast: Secondary | ICD-10-CM | POA: Diagnosis present

## 2018-12-20 DIAGNOSIS — E785 Hyperlipidemia, unspecified: Secondary | ICD-10-CM | POA: Diagnosis present

## 2018-12-20 DIAGNOSIS — Z7982 Long term (current) use of aspirin: Secondary | ICD-10-CM | POA: Diagnosis not present

## 2018-12-20 DIAGNOSIS — Z90710 Acquired absence of both cervix and uterus: Secondary | ICD-10-CM | POA: Diagnosis not present

## 2018-12-20 DIAGNOSIS — Z87440 Personal history of urinary (tract) infections: Secondary | ICD-10-CM

## 2018-12-20 DIAGNOSIS — Z79899 Other long term (current) drug therapy: Secondary | ICD-10-CM | POA: Diagnosis not present

## 2018-12-20 DIAGNOSIS — Z8249 Family history of ischemic heart disease and other diseases of the circulatory system: Secondary | ICD-10-CM

## 2018-12-20 DIAGNOSIS — I10 Essential (primary) hypertension: Secondary | ICD-10-CM | POA: Diagnosis present

## 2018-12-20 DIAGNOSIS — E213 Hyperparathyroidism, unspecified: Secondary | ICD-10-CM | POA: Diagnosis present

## 2018-12-20 DIAGNOSIS — K55029 Acute infarction of small intestine, extent unspecified: Secondary | ICD-10-CM | POA: Diagnosis present

## 2018-12-20 DIAGNOSIS — K56609 Unspecified intestinal obstruction, unspecified as to partial versus complete obstruction: Secondary | ICD-10-CM

## 2018-12-20 DIAGNOSIS — F329 Major depressive disorder, single episode, unspecified: Secondary | ICD-10-CM | POA: Diagnosis present

## 2018-12-20 DIAGNOSIS — R109 Unspecified abdominal pain: Secondary | ICD-10-CM | POA: Diagnosis present

## 2018-12-20 HISTORY — PX: INGUINAL HERNIA REPAIR: SHX194

## 2018-12-20 LAB — COMPREHENSIVE METABOLIC PANEL
ALT: 12 U/L (ref 0–44)
AST: 18 U/L (ref 15–41)
Albumin: 3.6 g/dL (ref 3.5–5.0)
Alkaline Phosphatase: 73 U/L (ref 38–126)
Anion gap: 10 (ref 5–15)
BUN: 12 mg/dL (ref 8–23)
CO2: 28 mmol/L (ref 22–32)
Calcium: 9.5 mg/dL (ref 8.9–10.3)
Chloride: 96 mmol/L — ABNORMAL LOW (ref 98–111)
Creatinine, Ser: 0.77 mg/dL (ref 0.44–1.00)
GFR calc Af Amer: >60 mL/min (ref >60)
GFR calc non Af Amer: >60 mL/min (ref >60)
Glucose, Bld: 148 mg/dL — ABNORMAL HIGH (ref 70–99)
Potassium: 4 mmol/L (ref 3.5–5.1)
Sodium: 134 mmol/L — ABNORMAL LOW (ref 135–145)
Total Bilirubin: 1.4 mg/dL — ABNORMAL HIGH (ref 0.3–1.2)
Total Protein: 7.1 g/dL (ref 6.5–8.1)

## 2018-12-20 LAB — CBC
HCT: 42.9 % (ref 36.0–46.0)
Hemoglobin: 13.8 g/dL (ref 12.0–15.0)
MCH: 28.2 pg (ref 26.0–34.0)
MCHC: 32.2 g/dL (ref 30.0–36.0)
MCV: 87.6 fL (ref 80.0–100.0)
Platelets: 316 10*3/uL (ref 150–400)
RBC: 4.9 MIL/uL (ref 3.87–5.11)
RDW: 13.1 % (ref 11.5–15.5)
WBC: 13.7 10*3/uL — ABNORMAL HIGH (ref 4.0–10.5)
nRBC: 0 % (ref 0.0–0.2)

## 2018-12-20 LAB — LIPASE, BLOOD: Lipase: 30 U/L (ref 11–51)

## 2018-12-20 LAB — SARS CORONAVIRUS 2 BY RT PCR (HOSPITAL ORDER, PERFORMED IN ~~LOC~~ HOSPITAL LAB): SARS Coronavirus 2: NEGATIVE

## 2018-12-20 SURGERY — REPAIR, HERNIA, INGUINAL, INCARCERATED
Anesthesia: General | Laterality: Right

## 2018-12-20 MED ORDER — IPRATROPIUM-ALBUTEROL 0.5-2.5 (3) MG/3ML IN SOLN
3.0000 mL | Freq: Once | RESPIRATORY_TRACT | Status: AC
  Administered 2018-12-20: 3 mL via RESPIRATORY_TRACT

## 2018-12-20 MED ORDER — ROCURONIUM BROMIDE 50 MG/5ML IV SOLN
INTRAVENOUS | Status: AC
  Filled 2018-12-20: qty 1

## 2018-12-20 MED ORDER — SUCCINYLCHOLINE CHLORIDE 20 MG/ML IJ SOLN: INTRAMUSCULAR | Status: DC | PRN

## 2018-12-20 MED ORDER — LEVOTHYROXINE SODIUM 50 MCG PO TABS
25.0000 ug | ORAL_TABLET | Freq: Every day | ORAL | Status: DC
  Administered 2018-12-21 – 2018-12-24 (×4): 25 ug via ORAL
  Filled 2018-12-20 (×4): qty 1

## 2018-12-20 MED ORDER — SUGAMMADEX SODIUM 200 MG/2ML IV SOLN
INTRAVENOUS | Status: AC
  Filled 2018-12-20: qty 2

## 2018-12-20 MED ORDER — BISOPROLOL-HYDROCHLOROTHIAZIDE 10-6.25 MG PO TABS
1.0000 | ORAL_TABLET | Freq: Every day | ORAL | Status: DC
  Administered 2018-12-21 – 2018-12-24 (×4): 1 via ORAL
  Filled 2018-12-20 (×5): qty 1

## 2018-12-20 MED ORDER — HYDROCODONE-ACETAMINOPHEN 5-325 MG PO TABS: 1.0000 | ORAL_TABLET | ORAL | Status: DC | PRN

## 2018-12-20 MED ORDER — BUPIVACAINE LIPOSOME 1.3 % IJ SUSP
INTRAMUSCULAR | Status: AC
  Filled 2018-12-20: qty 20

## 2018-12-20 MED ORDER — MORPHINE SULFATE (PF) 2 MG/ML IV SOLN: 2.0000 mg | INTRAVENOUS | Status: DC | PRN

## 2018-12-20 MED ORDER — SUCCINYLCHOLINE CHLORIDE 20 MG/ML IJ SOLN
INTRAMUSCULAR | Status: AC
  Filled 2018-12-20: qty 1

## 2018-12-20 MED ORDER — BUPIVACAINE-EPINEPHRINE (PF) 0.25% -1:200000 IJ SOLN
INTRAMUSCULAR | Status: AC
  Filled 2018-12-20: qty 30

## 2018-12-20 MED ORDER — FENTANYL CITRATE (PF) 100 MCG/2ML IJ SOLN
INTRAMUSCULAR | Status: AC
  Filled 2018-12-20: qty 2

## 2018-12-20 MED ORDER — LACTATED RINGERS IV SOLN
INTRAVENOUS | Status: DC | PRN
  Administered 2018-12-20: 18:00:00 via INTRAVENOUS

## 2018-12-20 MED ORDER — SODIUM CHLORIDE 0.9 % IV SOLN
INTRAVENOUS | Status: DC
  Administered 2018-12-21 – 2018-12-24 (×7): via INTRAVENOUS

## 2018-12-20 MED ORDER — ONDANSETRON HCL 4 MG/2ML IJ SOLN: 4.0000 mg | Freq: Once | INTRAMUSCULAR | Status: DC | PRN

## 2018-12-20 MED ORDER — ONDANSETRON HCL 4 MG/2ML IJ SOLN: 4.0000 mg | Freq: Four times a day (QID) | INTRAMUSCULAR | Status: DC | PRN

## 2018-12-20 MED ORDER — NITROFURANTOIN MONOHYD MACRO 100 MG PO CAPS
100.0000 mg | ORAL_CAPSULE | Freq: Two times a day (BID) | ORAL | Status: DC
  Administered 2018-12-21: 100 mg via ORAL
  Filled 2018-12-20 (×2): qty 1

## 2018-12-20 MED ORDER — ACETAMINOPHEN 650 MG RE SUPP: 650.0000 mg | Freq: Four times a day (QID) | RECTAL | Status: DC | PRN

## 2018-12-20 MED ORDER — FENTANYL CITRATE (PF) 100 MCG/2ML IJ SOLN
INTRAMUSCULAR | Status: DC | PRN
  Administered 2018-12-20 (×4): 50 ug via INTRAVENOUS

## 2018-12-20 MED ORDER — FENTANYL CITRATE (PF) 100 MCG/2ML IJ SOLN: 25.0000 ug | INTRAMUSCULAR | Status: DC | PRN

## 2018-12-20 MED ORDER — PROPOFOL 10 MG/ML IV BOLUS
INTRAVENOUS | Status: AC
  Filled 2018-12-20: qty 20

## 2018-12-20 MED ORDER — PROPOFOL 10 MG/ML IV BOLUS
INTRAVENOUS | Status: DC | PRN
  Administered 2018-12-20: 100 mg via INTRAVENOUS

## 2018-12-20 MED ORDER — ENOXAPARIN SODIUM 40 MG/0.4ML ~~LOC~~ SOLN
40.0000 mg | SUBCUTANEOUS | Status: DC
  Administered 2018-12-21 – 2018-12-23 (×4): 40 mg via SUBCUTANEOUS
  Filled 2018-12-20 (×4): qty 0.4

## 2018-12-20 MED ORDER — BUPIVACAINE HCL (PF) 0.5 % IJ SOLN
INTRAMUSCULAR | Status: AC
  Filled 2018-12-20: qty 30

## 2018-12-20 MED ORDER — SUGAMMADEX SODIUM 200 MG/2ML IV SOLN
INTRAVENOUS | Status: DC | PRN
  Administered 2018-12-20: 120 mg via INTRAVENOUS

## 2018-12-20 MED ORDER — ACETAMINOPHEN 325 MG PO TABS
650.0000 mg | ORAL_TABLET | Freq: Four times a day (QID) | ORAL | Status: DC | PRN
  Filled 2018-12-20: qty 2

## 2018-12-20 MED ORDER — IOHEXOL 300 MG/ML  SOLN
100.0000 mL | Freq: Once | INTRAMUSCULAR | Status: AC | PRN
  Administered 2018-12-20: 100 mL via INTRAVENOUS

## 2018-12-20 MED ORDER — MIRABEGRON ER 25 MG PO TB24
25.0000 mg | ORAL_TABLET | Freq: Every day | ORAL | Status: DC
  Administered 2018-12-21 – 2018-12-24 (×4): 25 mg via ORAL
  Filled 2018-12-20 (×5): qty 1

## 2018-12-20 MED ORDER — PANTOPRAZOLE SODIUM 40 MG IV SOLR
40.0000 mg | Freq: Every day | INTRAVENOUS | Status: DC
  Administered 2018-12-21 – 2018-12-23 (×4): 40 mg via INTRAVENOUS
  Filled 2018-12-20 (×4): qty 40

## 2018-12-20 MED ORDER — SEVOFLURANE IN SOLN
RESPIRATORY_TRACT | Status: AC
  Filled 2018-12-20: qty 250

## 2018-12-20 MED ORDER — PHENYLEPHRINE HCL (PRESSORS) 10 MG/ML IV SOLN
INTRAVENOUS | Status: AC
  Filled 2018-12-20: qty 1

## 2018-12-20 MED ORDER — AMPHETAMINE-DEXTROAMPHETAMINE 10 MG PO TABS: 10.0000 mg | ORAL_TABLET | Freq: Two times a day (BID) | ORAL | Status: DC

## 2018-12-20 MED ORDER — LIDOCAINE HCL (PF) 2 % IJ SOLN
INTRAMUSCULAR | Status: AC
  Filled 2018-12-20: qty 10

## 2018-12-20 MED ORDER — ONDANSETRON 4 MG PO TBDP: 4.0000 mg | ORAL_TABLET | Freq: Four times a day (QID) | ORAL | Status: DC | PRN

## 2018-12-20 MED ORDER — AMPHETAMINE-DEXTROAMPHETAMINE 10 MG PO TABS: 10.0000 mg | ORAL_TABLET | Freq: Every day | ORAL | Status: DC

## 2018-12-20 MED ORDER — BUPIVACAINE LIPOSOME 1.3 % IJ SUSP
INTRAMUSCULAR | Status: DC | PRN
  Administered 2018-12-20: 50 mL

## 2018-12-20 MED ORDER — ROCURONIUM BROMIDE 100 MG/10ML IV SOLN
INTRAVENOUS | Status: DC | PRN
  Administered 2018-12-20: 20 mg via INTRAVENOUS
  Administered 2018-12-20: 30 mg via INTRAVENOUS
  Administered 2018-12-20: 20 mg via INTRAVENOUS

## 2018-12-20 MED ORDER — PIPERACILLIN-TAZOBACTAM 3.375 G IVPB
3.3750 g | Freq: Three times a day (TID) | INTRAVENOUS | Status: DC
  Administered 2018-12-20 – 2018-12-24 (×11): 3.375 g via INTRAVENOUS
  Filled 2018-12-20 (×11): qty 50

## 2018-12-20 MED ORDER — EPHEDRINE SULFATE 50 MG/ML IJ SOLN
INTRAMUSCULAR | Status: AC
  Filled 2018-12-20: qty 1

## 2018-12-20 MED ORDER — SUCCINYLCHOLINE CHLORIDE 20 MG/ML IJ SOLN
INTRAMUSCULAR | Status: DC | PRN
  Administered 2018-12-20: 80 mg via INTRAVENOUS

## 2018-12-20 MED ORDER — LIDOCAINE HCL (CARDIAC) PF 100 MG/5ML IV SOSY
PREFILLED_SYRINGE | INTRAVENOUS | Status: DC | PRN
  Administered 2018-12-20: 30 mg via INTRAVENOUS

## 2018-12-20 MED ORDER — IPRATROPIUM-ALBUTEROL 0.5-2.5 (3) MG/3ML IN SOLN
RESPIRATORY_TRACT | Status: AC
  Filled 2018-12-20: qty 3

## 2018-12-20 MED ORDER — ONDANSETRON HCL 4 MG/2ML IJ SOLN
INTRAMUSCULAR | Status: DC | PRN
  Administered 2018-12-20: 4 mg via INTRAVENOUS

## 2018-12-20 SURGICAL SUPPLY — 34 items
BLADE SURG 15 STRL LF DISP TIS (BLADE) ×1 IMPLANT
BLADE SURG 15 STRL SS (BLADE) ×2
CANISTER SUCT 1200ML W/VALVE (MISCELLANEOUS) ×3 IMPLANT
CHLORAPREP W/TINT 26 (MISCELLANEOUS) ×3 IMPLANT
COVER WAND RF STERILE (DRAPES) IMPLANT
DERMABOND ADVANCED (GAUZE/BANDAGES/DRESSINGS) ×2
DERMABOND ADVANCED .7 DNX12 (GAUZE/BANDAGES/DRESSINGS) ×1 IMPLANT
DRAIN PENROSE 1/4X12 LTX (DRAIN) IMPLANT
DRAPE LAPAROTOMY 100X77 ABD (DRAPES) ×3 IMPLANT
ELECT REM PT RETURN 9FT ADLT (ELECTROSURGICAL) ×3
ELECTRODE REM PT RTRN 9FT ADLT (ELECTROSURGICAL) ×1 IMPLANT
GLOVE BIO SURGEON STRL SZ 6.5 (GLOVE) ×10 IMPLANT
GLOVE BIO SURGEONS STRL SZ 6.5 (GLOVE) ×5
GLOVE INDICATOR 6.5 STRL GRN (GLOVE) ×3 IMPLANT
GOWN STRL REUS W/ TWL LRG LVL3 (GOWN DISPOSABLE) ×3 IMPLANT
GOWN STRL REUS W/TWL LRG LVL3 (GOWN DISPOSABLE) ×6
LABEL OR SOLS (LABEL) ×3 IMPLANT
LIGASURE IMPACT 36 18CM CVD LR (INSTRUMENTS) ×3 IMPLANT
NEEDLE HYPO 22GX1.5 SAFETY (NEEDLE) ×3 IMPLANT
NS IRRIG 500ML POUR BTL (IV SOLUTION) ×3 IMPLANT
PACK BASIN MAJOR ARMC (MISCELLANEOUS) ×3 IMPLANT
PACK BASIN MINOR ARMC (MISCELLANEOUS) IMPLANT
RELOAD PROXIMATE 75MM BLUE (ENDOMECHANICALS) ×9 IMPLANT
STAPLER PROXIMATE 75MM BLUE (STAPLE) ×3 IMPLANT
SUT MNCRL 4-0 (SUTURE) ×2
SUT MNCRL 4-0 27XMFL (SUTURE) ×1
SUT SURGILON 0 BLK (SUTURE) ×6 IMPLANT
SUT VIC AB 2-0 BRD 54 (SUTURE) ×3 IMPLANT
SUT VIC AB 2-0 CT2 27 (SUTURE) ×3 IMPLANT
SUT VIC AB 3-0 SH 27 (SUTURE) ×4
SUT VIC AB 3-0 SH 27X BRD (SUTURE) ×2 IMPLANT
SUTURE MNCRL 4-0 27XMF (SUTURE) ×1 IMPLANT
SYR 10ML LL (SYRINGE) ×3 IMPLANT
TRAY FOLEY SLVR 16FR LF STAT (SET/KITS/TRAYS/PACK) ×3 IMPLANT

## 2018-12-20 NOTE — ED Triage Notes (Signed)
Pt reporting one episode of diarrhea yesterday but denies having a BM since then. Pt also reporting slight nausea but verbalized thoughts that this was due to constipation and, :it is not going up or down"   Pt reporting she is concerned for an abd hernia as well.

## 2018-12-20 NOTE — ED Provider Notes (Signed)
Patient was seen and examined by me.  Clinically with an incarcerated hernia.  CT confirmed similar.  I was unable to reduce at the bedside.  She does still have bowel sounds although hypoactive.  She does not seem to be in severe pain at this time.  We have consulted surgery for emergent evaluation and operation.   Earleen Newport, MD 12/20/18 1534

## 2018-12-20 NOTE — Op Note (Signed)
Preoperative diagnosis: Right Inguinal Hernia.   Postoperative diagnosis: Right strangulated inguinal Hernia. Ischemic small bowel.  Procedure: Primary repair of right inguinal hernia.                       Small bowel resection with anastomosis  Anesthesia: General   Surgeon: Dr. Windell Moment  Assistant surgeon: Dr. Lysle Pearl (due to the complexity of the case, need of exposure and use of surgical devices.)  Wound Classification: Contaminated  Indications:  Patient is a 78 y.o. female developed acute severe pain and a large lump in the right groin with physical exam consistent with incarcerated inguinal hernia with skin changes. CT scan also shows incarcerated bowel with fluid on sac and fat stranding. Emergent repair was indicated, and because of the presence of strangulated bowel, primary repair was elected.   Findings: 1. 10 cm on necrotic small bowel.  2. Viable proximal and distal bowel.  3. Adequate hemostasis  Description of procedure: The patient was taken to the operating room. A time-out was completed verifying correct patient, procedure, site, positioning, and implant(s) and/or special equipment prior to beginning this procedure.  General anesthesia was induced. The right groin was prepped and draped in the usual sterile fashion. An incision was marked in a natural skin crease parallel to the inguinal ligament and planned to end near the pubic tubercle.  The skin crease incision was made with a knife and deepened through Camper's and Scarpa's fascia with electrocautery until the aponeurosis of the external oblique was encountered. This was cleaned and the external ring was exposed. Hemostasis was achieved in the wound. A large hernia sac was identified coming out from the inguinal canal. An incision was made in the midportion of the external oblique aponeurosis in the direction of its fibers. Flaps of external oblique were developed cephalad and inferiorly.  The large hernia sac  looked dark it was dissected circumferentially. The sac was opened and a large amount of dark fluid was aspirated.  A loop of small bowel was identified necrotic.  The inguinal ligament was partially transected to be able to open a little bit more in the hernia defect and eviscerated the small bowel.   The segment of nonviable small bowel was 10 cm long. A window was created by using a curved hemostat to separate the mesentery from the bowel at each resection margin. The bowel was divided with a cutting linear stapler at each resection. The mesentery was divided with LigaSure device. The antimesenteric angles of the proximal and distal segments were then approximated with two sutures of 3-0 silk placed approximately 5 cm apart. Enterotomies were made at the antimesenteric borders and the cutting linear stapler inserted and fired. The lumen was inspected for hemostasis. The enterotomies were closed with a linear stapler. The mesenteric defect was closed with a running 3-0 Vicryl suture. The anastomosis was then inspected for patency and integrity and reduced back into the abdominal cavity.   A finger was passed into the peritoneal cavity and the floor of the inguinal canal assessed and found to be weak. The femoral canal was palpated and no hernia identified. The hernia sac was closed with 2-0 Vicryl and also reduced back.  Redundant sac was resected and sent to pathology. The stump of the sac was checked for hemostasis and allowed to retract into the abdomen.  Attention was then turned to the floor of the canal, which appeared to be intact with the exception of a dilated internal  ring. Conjoint tendon was identified and grasped with Allis clamps.  The anterior rectus sheath was incised with electrocautery medial and well superior to the conjoint tendon. Hemostasis was checked. The conjoint tendon then reached easily to the inguinal ligament with no tension whatsoever.  The conjoint tendon was then sutured to the  shelving edge of the inguinal ligament with multiple simple sutures of 0 Surgilon sutures. This suture line began at the pubic tubercle and commenced laterally to the internal ring. Care was taken not to take deep bites and endanger the femoral vessels.  Hemostasis was again checked. The external oblique aponeurosis was closed with a running suture of 3-0 Vicryl, taking care not to catch the ilioinguinal nerve in the suture line. Scarpa's fascia was closed with interrupted 3-0 Vicryl. The skin was closed with staples. A sterile dressing was applied.  The patient tolerated the procedure well and was taken to the postanesthesia care unit in stable condition. NGT left in place.  Specimen: Hernia Sac  Complications: None  Estimated Blood Loss: 25 mL

## 2018-12-20 NOTE — H&P (Signed)
SURGICAL CONSULTATION NOTE   HISTORY OF PRESENT ILLNESS (HPI):  78 y.o. female presented to [facility] ED for evaluation of abdominal pain since yesterday.  Patient reports that she felt severe abdominal pain yesterday on the right groin area.  She reports she felt area of induration in the right groin that got bigger while she was at [employer].  She continued having pain.  She reports that she had a bowel movement yesterday but she has not had any bowel movement today.  She denies passing gas.  She denies nausea or vomiting.  She denies fever or chills.  Pain does not radiate to other part of the body.  Pain is aggravated by palpation.  There is no alleviating factor.  Patient came to the ED labs shows leukocytosis and CT scan shows a right groin hernia with small bowel and fluid inside the hernia.  I personally reviewed the images.  Surgery is consulted by Dr. Mayford KnifeWilliams in this context for evaluation and management of incarcerated inguinal hernia.  PAST MEDICAL HISTORY (PMH):  Past Medical History:  Diagnosis Date  . Chiari malformation    hx with cervical syringomyelia, s/p surgery at [facility] 10-12 years ago  . Depression   . Fibrocystic breast disease   . HTN (hypertension)   . Hyperparathyroidism    s/p parathyroidectomy in 9/10  . Normal echocardiogram    EF 50-55%, mild LVH, trivial AI, no other valvular disease  . RBBB (right bundle branch block with left anterior fascicular block)   . Recurrent UTI    and urinary incontinence     PAST SURGICAL HISTORY (PSH):  Past Surgical History:  Procedure Laterality Date  . ABDOMINAL HYSTERECTOMY    . chiari malformation    . hysterectomy (other)    . leg vein    . parathyroid surgery       MEDICATIONS:  Prior to Admission medications   Medication Sig Start Date End Date Taking? Authorizing Provider  Amphetamine-Dextroamphetamine (ADDERALL PO) Take 10 mg by mouth daily.     [provider]  aspirin 325 MG tablet Take 325 mg  by mouth daily.    [provider]  bisoprolol-hydrochlorothiazide (ZIAC) 10-6.25 MG tablet Take 1 tablet by mouth daily.    [provider]  conjugated estrogens (PREMARIN) vaginal cream Place 1 Applicatorful vaginally daily. Use pea sized amount M-W-Fr before bedtime 06/17/17   Vanna ScotlandBrandon, Ashley, MD  cyanocobalamin 100 MCG tablet Take 100 mcg by mouth daily.    [provider]  mirabegron ER (MYRBETRIQ) 25 MG TB24 tablet Take 1 tablet (25 mg total) by mouth daily. 06/17/17   Vanna ScotlandBrandon, Ashley, MD  nitrofurantoin, macrocrystal-monohydrate, (MACROBID) 100 MG capsule Take 1 capsule (100 mg total) by mouth 2 (two) times daily. 07/18/17   Vanna ScotlandBrandon, Ashley, MD     ALLERGIES:  Allergies  Allergen Reactions  . Levofloxacin     Caused arm pain.     SOCIAL HISTORY:  Social History   Socioeconomic History  . Marital status: Married    Spouse name: Not on file  . Number of children: Not on file  . Years of education: Not on file  . Highest education level: Not on file  Occupational History  . Not on file  Social Needs  . Financial resource strain: Not on file  . Food insecurity    Worry: Not on file    Inability: Not on file  . Transportation needs    Medical: Not on file    Non-medical:  Not on file  Tobacco Use  . Smoking status: Never Smoker  . Smokeless tobacco: Never Used  Substance and Sexual Activity  . Alcohol use: No  . Drug use: No  . Sexual activity: Yes    Birth control/protection: Surgical  Lifestyle  . Physical activity    Days per week: Not on file    Minutes per session: Not on file  . Stress: Not on file  Relationships  . Social Musicianconnections    Talks on phone: Not on file    Gets together: Not on file    Attends religious service: Not on file    Active member of club or organization: Not on file    Attends meetings of clubs or organizations: Not on file    Relationship status: Not on file  . Intimate partner violence    Fear of current  or ex partner: Not on file    Emotionally abused: Not on file    Physically abused: Not on file    Forced sexual activity: Not on file  Other Topics Concern  . Not on file  Social History Narrative   Retired. Lives in [place]. Does not regularly exercise    The patient currently resides (home / rehab facility / nursing home): Home The patient normally is (ambulatory / bedbound): Ambulatory   FAMILY HISTORY:  Family History  Problem Relation Age of Onset  . Alzheimer's disease Mother   . Stroke Mother   . Breast cancer Mother   . Heart failure Father   . Ovarian cancer Neg Hx   . Colon cancer Neg Hx   . Diabetes Neg Hx      REVIEW OF SYSTEMS:  Constitutional: denies weight loss, fever, chills, or sweats  Eyes: denies any other vision changes, history of eye injury  ENT: denies sore throat, hearing problems  Respiratory: denies shortness of breath, wheezing  Cardiovascular: denies chest pain, palpitations  Gastrointestinal: Sleep abdominal pain Genitourinary: denies burning with urination or urinary frequency Musculoskeletal: denies any other joint pains or cramps  Skin: denies any other rashes or skin discolorations  Neurological: denies any other headache, dizziness, weakness  Psychiatric: denies any other depression, anxiety   All other review of systems were negative   VITAL SIGNS:  Temp:  [98.9 F (37.2 C)] 98.9 F (37.2 C) (06/21 1229) Pulse Rate:  [85] 85 (06/21 1229) Resp:  [16] 16 (06/21 1229) BP: (157)/(87) 157/87 (06/21 1229) SpO2:  [94 %] 94 % (06/21 1229) Weight:  [59 kg] 59 kg (06/21 1234)     Height: 4\' 8"  (142.2 cm) Weight: 59 kg BMI (Calculated): 29.18   INTAKE/OUTPUT:  This shift: No intake/output data recorded.  Last 2 shifts: @IOLAST2SHIFTS @   PHYSICAL EXAM:  Constitutional:  -- Normal body habitus  -- Awake, alert, and oriented x3  Eyes:  -- Pupils equally round and reactive to light  -- No scleral icterus  Ear, nose, and throat:  --  No jugular venous distension  Pulmonary:  -- No crackles  -- Equal breath sounds bilaterally -- Breathing non-labored at rest Cardiovascular:  -- S1, S2 present  -- No pericardial rubs Gastrointestinal:  -- Abdomen soft, nontender, mildly-distended, no guarding or rebound tenderness -- No abdominal masses appreciated, pulsatile or otherwise --Right groin hernia with mild skin changes on the right groin Musculoskeletal and Integumentary:  -- Wounds or skin discoloration: None appreciated -- Extremities: B/L UE and LE FROM, hands and feet warm, no edema  Neurologic:  -- Motor function:  intact and symmetric -- Sensation: intact and symmetric   Labs:  CBC Latest Ref Rng & Units 12/20/2018 02/27/2009  WBC 4.0 - 10.5 K/uL 13.7(H) 5.8  Hemoglobin 12.0 - 15.0 g/dL 16.113.8 09.614.0  Hematocrit 04.536.0 - 46.0 % 42.9 41.5  Platelets 150 - 400 K/uL 316 242   CMP Latest Ref Rng & Units 12/20/2018 10/12/2010 05/09/2009  Glucose 70 - 99 mg/dL 409(W148(H) 119(J103(H) 94  BUN 8 - 23 mg/dL 12 18 15   Creatinine 0.44 - 1.00 mg/dL 4.780.77 2.950.84 6.210.91  Sodium 135 - 145 mmol/L 134(L) 140 141  Potassium 3.5 - 5.1 mmol/L 4.0 4.2 4.2  Chloride 98 - 111 mmol/L 96(L) 102 103  CO2 22 - 32 mmol/L 28 27 24   Calcium 8.9 - 10.3 mg/dL 9.5 9.5 9.2  Total Protein 6.5 - 8.1 g/dL 7.1 - -  Total Bilirubin 0.3 - 1.2 mg/dL 3.0(Q1.4(H) - -  Alkaline Phos 38 - 126 U/L 73 - -  AST 15 - 41 U/L 18 - -  ALT 0 - 44 U/L 12 - -   Imaging studies:  EXAM: CT ABDOMEN AND PELVIS WITH CONTRAST  TECHNIQUE: Multidetector CT imaging of the abdomen and pelvis was performed using the standard protocol following bolus administration of intravenous contrast.  CONTRAST:  100mL OMNIPAQUE IOHEXOL 300 MG/ML  SOLN  COMPARISON:  None.  FINDINGS: Lower chest: Bibasilar scarring type changes but no infiltrates or effusions. The heart is mildly enlarged. No pericardial effusion. Coronary artery and aortic calcifications are noted. There is a moderate-sized  hiatal hernia.  Hepatobiliary: No focal hepatic lesions or intrahepatic biliary dilatation. The gallbladder appears normal. Mild age related common bile duct dilatation.  Pancreas: No mass, inflammation or ductal dilatation. Moderate-sized duodenal diverticuli are no around the pancreas.  Spleen: Normal size.  Small calcified granulomas.  Adrenals/Urinary Tract: The adrenal glands and kidneys are unremarkable. No worrisome renal lesions or hydronephrosis. The bladder is unremarkable. There is a small amount of air in the bladder which may be due to recent catheterization or instrumentation.  Stomach/Bowel: Findings consistent with an early small bowel obstruction with dilated mid small bowel loops down to a right inguinal hernia. There is inflammation and fluid in the hernia sac along with surrounding interstitial changes. Findings worrisome for incarceration and associated obstruction. The small bowel loop coming out of the hernia is decompressed/normal in caliber and the remaining mid distal small bowel appears normal.  Severe colonic diverticulosis without findings for acute diverticulitis.  Vascular/Lymphatic: Moderate atherosclerotic calcifications involving the aorta and branch vessels but no aneurysm or dissection. The branch vessels are patent. The major venous structures are patent.  Small scattered mesenteric and retroperitoneal lymph nodes but no mass or overt adenopathy.  Reproductive: Surgically absent.  Other: No free pelvic fluid collections or pelvic abscess. No pelvic adenopathy. No subcutaneous lesions.  Musculoskeletal: Age related osteoporosis but no obvious fracture or worrisome bone lesion.  IMPRESSION: 1. Early small bowel obstruction due to incarcerated right inguinal hernia. 2. Diffuse colonic diverticulosis without findings for acute diverticulitis. 3. Multiple duodenal diverticuli. 4. Moderate-sized hiatal  hernia.   Electronically Signed   By: Rudie MeyerP.  Gallerani M.D.   On: 12/20/2018 14:56   Assessment/Plan: 78 y.o. female with incarcerated right inguinal hernia with small bowel obstruction, complicated by pertinent comorbidities including hypertension Chiari malformation, hyperlipidemia and right bundle blanch block.  Patient was oriented about the physical exam and CT images showing right groin hernia with small bowel incarceration causing obstruction.  I recommended the patient  to proceed with urgent surgical management to repair the hernia as soon as possible to try to avoid ischemia of the bowel.  I explained the patient that I will evaluate the intestine and if it is ischemic I will need to do a small bowel resection with anastomosis in addition to the repair of the inguinal hernia.  I discussed with the patient that the risks of the surgery include small bowel perforation, bowel injury, bleeding, infection, need of subsequent surgeries, pain.  Patient reports that she understood and agreed to proceed with surgery.  Husband notified about findings and recommendation of surgery.   Arnold Long, MD

## 2018-12-20 NOTE — Anesthesia Preprocedure Evaluation (Signed)
Anesthesia Evaluation  Patient identified by MRN, date of birth, ID band Patient awake    Reviewed: Allergy & Precautions, NPO status , Patient's Chart, lab work & pertinent test results, reviewed documented beta blocker date and time   Airway Mallampati: III  TM Distance: <3 FB     Dental   Pulmonary neg pulmonary ROS,    Pulmonary exam normal        Cardiovascular hypertension, Pt. on medications and Pt. on home beta blockers Normal cardiovascular exam     Neuro/Psych PSYCHIATRIC DISORDERS Depression  Neuromuscular disease    GI/Hepatic Neg liver ROS,   Endo/Other  Hypothyroidism   Renal/GU negative Renal ROS Bladder dysfunction      Musculoskeletal negative musculoskeletal ROS (+)   Abdominal Normal abdominal exam  (+)   Peds negative pediatric ROS (+)  Hematology negative hematology ROS (+)   Anesthesia Other Findings Past Medical History: No date: Chiari malformation     Comment:  hx with cervical syringomyelia, s/p surgery at [facility]               10-12 years ago No date: Depression No date: Fibrocystic breast disease No date: HTN (hypertension) No date: Hyperparathyroidism     Comment:  s/p parathyroidectomy in 9/10 No date: Normal echocardiogram     Comment:  EF 50-55%, mild LVH, trivial AI, no other valvular               disease No date: RBBB (right bundle branch block with left anterior  fascicular block) No date: Recurrent UTI     Comment:  and urinary incontinence  Reproductive/Obstetrics                             Anesthesia Physical Anesthesia Plan  ASA: III and emergent  Anesthesia Plan: General   Post-op Pain Management:    Induction: Intravenous, Rapid sequence and Cricoid pressure planned  PONV Risk Score and Plan:   Airway Management Planned: Oral ETT  Additional Equipment:   Intra-op Plan:   Post-operative Plan: Extubation in OR  Informed  Consent: I have reviewed the patients History and Physical, chart, labs and discussed the procedure including the risks, benefits and alternatives for the proposed anesthesia with the patient or authorized representative who has indicated his/her understanding and acceptance.     Dental advisory given  Plan Discussed with: CRNA and Surgeon  Anesthesia Plan Comments:         Anesthesia Quick Evaluation

## 2018-12-20 NOTE — Anesthesia Procedure Notes (Signed)
Procedure Name: Intubation Date/Time: 12/20/2018 6:28 PM Performed by: Lendon Colonel, CRNA Pre-anesthesia Checklist: Patient identified, Patient being monitored, Timeout performed, Emergency Drugs available and Suction available Patient Re-evaluated:Patient Re-evaluated prior to induction Oxygen Delivery Method: Circle system utilized Preoxygenation: Pre-oxygenation with 100% oxygen Induction Type: IV induction, Rapid sequence and Cricoid Pressure applied Laryngoscope Size: 3 and McGraph Grade View: Grade I Tube type: Oral Tube size: 7.0 mm Number of attempts: 1 Airway Equipment and Method: Stylet Placement Confirmation: ETT inserted through vocal cords under direct vision,  positive ETCO2 and breath sounds checked- equal and bilateral Secured at: 19 cm Tube secured with: Tape Dental Injury: Teeth and Oropharynx as per pre-operative assessment  Difficulty Due To: Difficult Airway- due to anterior larynx

## 2018-12-20 NOTE — Transfer of Care (Signed)
Immediate Anesthesia Transfer of Care Note  Patient: Lorraine Ferguson  Procedure(s) Performed: HERNIA REPAIR INGUINAL INCARCERATED (Right )  Patient Location: PACU  Anesthesia Type:General  Level of Consciousness: sedated and patient cooperative  Airway & Oxygen Therapy: Patient Spontanous Breathing and Patient connected to face mask oxygen  Post-op Assessment: Report given to RN and Post -op Vital signs reviewed and stable  Post vital signs: Reviewed and stable  Last Vitals:  Vitals Value Taken Time  BP 157/100 12/20/18 2110  Temp 36.4 C 12/20/18 2110  Pulse 81 12/20/18 2112  Resp 20 12/20/18 2112  SpO2 97 % 12/20/18 2112  Vitals shown include unvalidated device data.  Last Pain:  Vitals:   12/20/18 2110  TempSrc:   PainSc: Asleep         Complications: No apparent anesthesia complications

## 2018-12-20 NOTE — ED Provider Notes (Signed)
[facility] Emergency Department Provider Note  ____________________________________________   None    (approximate)  I have reviewed the triage vital signs and the nursing notes.   HISTORY  Chief Complaint Abdominal Pain    HPI Janett Billownn B Dockstader is a 78 y.o. female presents emergency department complaining of abdominal pain with one episode of diarrhea yesterday.  No BM since then.  She states she has a very hard raised area that is usually soft.  She thinks it might be a hernia that has gotten some "stuff stuck".  She states she has some nausea but no vomiting.  She does not feel like any of her bowels are moving where they should.  Denies any fever or chills.  Denies cough or congestion.    Past Medical History:  Diagnosis Date  . Chiari malformation    hx with cervical syringomyelia, s/p surgery at [facility] 10-12 years ago  . Depression   . Fibrocystic breast disease   . HTN (hypertension)   . Hyperparathyroidism    s/p parathyroidectomy in 9/10  . Normal echocardiogram    EF 50-55%, mild LVH, trivial AI, no other valvular disease  . RBBB (right bundle branch block with left anterior fascicular block)   . Recurrent UTI    and urinary incontinence    Patient Active Problem List   Diagnosis Date Noted  . Chronic fatigue 11/02/2016  . Cystocele, midline 04/10/2016  . Vaginal atrophy 04/10/2016  . SUI (stress urinary incontinence, female) 04/10/2016  . Urinary incontinence in female 04/10/2016  . Recurrent UTI 04/10/2016  . Tachycardia 06/11/2011  . Essential hypertension 04/25/2009  . ELECTROCARDIOGRAM, ABNORMAL 04/25/2009    Past Surgical History:  Procedure Laterality Date  . ABDOMINAL HYSTERECTOMY    . chiari malformation    . hysterectomy (other)    . leg vein    . parathyroid surgery      Prior to Admission medications   Medication Sig Start Date End Date Taking? Authorizing Provider  Amphetamine-Dextroamphetamine (ADDERALL PO) Take  10 mg by mouth daily.     [provider]  aspirin 325 MG tablet Take 325 mg by mouth daily.    [provider]  bisoprolol-hydrochlorothiazide (ZIAC) 10-6.25 MG tablet Take 1 tablet by mouth daily.    [provider]  conjugated estrogens (PREMARIN) vaginal cream Place 1 Applicatorful vaginally daily. Use pea sized amount M-W-Fr before bedtime 06/17/17   Vanna ScotlandBrandon, Ashley, MD  cyanocobalamin 100 MCG tablet Take 100 mcg by mouth daily.    [provider]  mirabegron ER (MYRBETRIQ) 25 MG TB24 tablet Take 1 tablet (25 mg total) by mouth daily. 06/17/17   Vanna ScotlandBrandon, Ashley, MD  nitrofurantoin, macrocrystal-monohydrate, (MACROBID) 100 MG capsule Take 1 capsule (100 mg total) by mouth 2 (two) times daily. 07/18/17   Vanna ScotlandBrandon, Ashley, MD    Allergies Levofloxacin  Family History  Problem Relation Age of Onset  . Alzheimer's disease Mother   . Stroke Mother   . Breast cancer Mother   . Heart failure Father   . Ovarian cancer Neg Hx   . Colon cancer Neg Hx   . Diabetes Neg Hx     Social History Social History   Tobacco Use  . Smoking status: Never Smoker  . Smokeless tobacco: Never Used  Substance Use Topics  . Alcohol use: No  . Drug use: No    Review of Systems  Constitutional: No fever/chills Eyes: No visual changes. ENT: No sore throat. Respiratory: Denies cough  Gastrointestinal: Positive for abdominal pain Genitourinary: Negative for dysuria. Musculoskeletal: Negative for back pain. Skin: Negative for rash.    ____________________________________________   PHYSICAL EXAM:  VITAL SIGNS: ED Triage Vitals  Enc Vitals Group     BP 12/20/18 1229 (!) 157/87     Pulse Rate 12/20/18 1229 85     Resp 12/20/18 1229 16     Temp 12/20/18 1229 98.9 F (37.2 C)     Temp Source 12/20/18 1229 Oral     SpO2 12/20/18 1229 94 %     Weight 12/20/18 1230 130 lb (59 kg)     Height 12/20/18 1230 4\' 8"  (1.422 m)     Head Circumference --      Peak  Flow --      Pain Score 12/20/18 1233 5     Pain Loc --      Pain Edu? --      Excl. in GC? --     Constitutional: Alert and oriented. Well appearing and in no acute distress. Eyes: Conjunctivae are normal.  Head: Atraumatic. Nose: No congestion/rhinnorhea. Mouth/Throat: Mucous membranes are moist.   Neck:  supple no lymphadenopathy noted Cardiovascular: Normal rate, regular rhythm. Heart sounds are normal Respiratory: Normal respiratory effort.  No retractions, lungs c t a  Abd: soft tender in the lower pelvic area where a large baseball sized mass that is reddened is noted.  The area is very hard to palpation, Bs normal all 4 quad GU: deferred Musculoskeletal: FROM all extremities, warm and well perfused Neurologic:  Normal speech and language.  Skin:  Skin is warm, dry and intact. No rash noted. Psychiatric: Mood and affect are normal. Speech and behavior are normal.  ____________________________________________   LABS (all labs ordered are listed, but only abnormal results are displayed)  Labs Reviewed  COMPREHENSIVE METABOLIC PANEL - Abnormal; Notable for the following components:      Result Value   Sodium 134 (*)    Chloride 96 (*)    Glucose, Bld 148 (*)    Total Bilirubin 1.4 (*)    All other components within normal limits  CBC - Abnormal; Notable for the following components:   WBC 13.7 (*)    All other components within normal limits  SARS CORONAVIRUS 2 (HOSPITAL ORDER, PERFORMED IN [facility])  LIPASE, BLOOD  URINALYSIS, COMPLETE (UACMP) WITH MICROSCOPIC   ____________________________________________   ____________________________________________  RADIOLOGY  CT abdomen/pelvis with IV contrast shows an incarcerated right inguinal hernia which is producing a small bowel obstruction  ____________________________________________   PROCEDURES  Procedure(s) performed: Saline  lock  Procedures    ____________________________________________   INITIAL IMPRESSION / ASSESSMENT AND PLAN / ED COURSE  Pertinent labs & imaging results that were available during my care of the patient were reviewed by me and considered in my medical decision making (see chart for details).   Patient is 78 year old female presents emergency department with complaints of abdominal pain, hard area on her lower abdomen, and diarrhea yesterday.  No bowel movement since then.  She states she is afraid it is a hernia that has gotten "stuff stuck"  Physical exam shows patient to appear well.  The abdomen is soft but tender with a hard area noted at the pelvis area.  Area size of a baseball and the skin is reddened.  DDX: Mass, incarcerated hernia, abdominal pain, viral illness, blockage  CBC has elevated WBC at 13.7, comprehensive metabolic panel has low sodium at 134, elevated glucose 148:  Lipase is normal.  CT abdomen/pelvis with IV contrast ordered.    ----------------------------------------- 3:06 PM on 12/20/2018 -----------------------------------------  CT abdomen/pelvis shows a incarcerated right inguinal hernia which is producing a small bowel obstruction.  Dr. Jimmye Norman In to see the patient.  Paged Dr. Peyton Najjar.  He will come to the ED to see the patient.  COVID-19 test ordered.   CADY HAFEN was evaluated in Emergency Department on 12/20/2018 for the symptoms described in the history of present illness. She was evaluated in the context of the global COVID-19 pandemic, which necessitated consideration that the patient might be at risk for infection with the SARS-CoV-2 virus that causes COVID-19. Institutional protocols and algorithms that pertain to the evaluation of patients at risk for COVID-19 are in a state of rapid change based on information released by regulatory bodies including the CDC and federal and state organizations. These policies and algorithms were followed  during the patient's care in the ED.   As part of my medical decision making, I reviewed the following data within the [facility] notes reviewed and incorporated, Labs reviewed see above, Old chart reviewed, Radiograph reviewed CT abdomen/pelvis shows a right inguinal hernia incarcerated, small bowel obstruction, A consult was requested and obtained from this/these consultant(s) Surgery, Evaluated by EM attending Dr. Jimmye Norman, Notes from prior ED visits and [place] Controlled Substance Database  ____________________________________________   FINAL CLINICAL IMPRESSION(S) / ED DIAGNOSES  Final diagnoses:  Incarcerated inguinal hernia, unilateral  Small bowel obstruction ([place])      NEW MEDICATIONS STARTED DURING THIS VISIT:  New Prescriptions   No medications on file     Note:  This document was prepared using Dragon voice recognition software and may include unintentional dictation errors.    Versie Starks, PA-C 12/20/18 1514    Earleen Newport, MD 12/20/18 1535

## 2018-12-20 NOTE — Anesthesia Post-op Follow-up Note (Signed)
Anesthesia QCDR form completed.        

## 2018-12-20 NOTE — ED Notes (Signed)
Pt with c/o of lower abdominal/pelvic pain. Pt states pain is chronic and has been to several specialists. Pt states last BM was yesterday with sudden onset. Pt states she is constipated and just has "too much junk" in her. Pt denies N/V.

## 2018-12-20 NOTE — Telephone Encounter (Signed)
Encounter entered by erros

## 2018-12-20 NOTE — ED Notes (Signed)
ED TO INPATIENT HANDOFF REPORT  ED Nurse Name and Phone #:  [phone]  S Name/Age/Gender Lorraine Ferguson 78 y.o. female Room/Bed: EDOTFA/EDOTF  Code Status   Code Status: Full Code  Home/SNF/Other Home Patient oriented to: self, place, time and situation Is this baseline? Yes   Triage Complete: Triage complete  Chief Complaint Constipation   Triage Note Pt reporting one episode of diarrhea yesterday but denies having a BM since then. Pt also reporting slight nausea but verbalized thoughts that this was due to constipation and, :it is not going up or down"   Pt reporting she is concerned for an abd hernia as well.    Allergies Allergies  Allergen Reactions  . Levofloxacin     Caused arm pain.    Level of Care/Admitting Diagnosis ED Disposition    ED Disposition Condition Comment   Admit  Hospital Area: [facility] [[id number]]  Level of Care: Med-Surg [16]  Covid Evaluation: Screening Protocol (No Symptoms)  Diagnosis: Incarcerated inguinal hernia, unilateral [[id number]][[id number]]  Admitting Physician: Carolan ShiverINTRON-DIAZ, EDGARDO [[id number]]  Attending Physician: Carolan ShiverINTRON-DIAZ, EDGARDO [[id number]]  Estimated length of stay: past midnight tomorrow  Certification:: I certify this patient will need inpatient services for at least 2 midnights  PT Class (Do Not Modify): Inpatient [101]  PT Acc Code (Do Not Modify): Private [1]       B Medical/Surgery History Past Medical History:  Diagnosis Date  . Chiari malformation    hx with cervical syringomyelia, s/p surgery at [facility] 10-12 years ago  . Depression   . Fibrocystic breast disease   . HTN (hypertension)   . Hyperparathyroidism    s/p parathyroidectomy in 9/10  . Normal echocardiogram    EF 50-55%, mild LVH, trivial AI, no other valvular disease  . RBBB (right bundle branch block with left anterior fascicular block)   . Recurrent UTI    and urinary incontinence   Past Surgical History:  Procedure Laterality Date  .  ABDOMINAL HYSTERECTOMY    . chiari malformation    . hysterectomy (other)    . leg vein    . parathyroid surgery       A IV Location/Drains/Wounds Patient Lines/Drains/Airways Status   Active Line/Drains/Airways    Name:   Placement date:   Placement time:   Site:   Days:   Peripheral IV 12/20/18 Right Wrist   12/20/18    1438    Wrist   less than 1          Intake/Output Last 24 hours No intake or output data in the 24 hours ending 12/20/18 1751  Labs/Imaging Results for orders placed or performed during the hospital encounter of 12/20/18 (from the past 48 hour(s))  Lipase, blood     Status: None   Collection Time: 12/20/18  1:01 PM  Result Value Ref Range   Lipase 30 11 - 51 U/L    Comment: Performed at [facility], [address] [place], [place] [address]  Comprehensive metabolic panel     Status: Abnormal   Collection Time: 12/20/18  1:01 PM  Result Value Ref Range   Sodium 134 (L) 135 - 145 mmol/L   Potassium 4.0 3.5 - 5.1 mmol/L   Chloride 96 (L) 98 - 111 mmol/L   CO2 28 22 - 32 mmol/L   Glucose, Bld 148 (H) 70 - 99 mg/dL   BUN 12 8 - 23 mg/dL   Creatinine, Ser 5.780.77 0.44 - 1.00 mg/dL   Calcium 9.5  8.9 - 10.3 mg/dL   Total Protein 7.1 6.5 - 8.1 g/dL   Albumin 3.6 3.5 - 5.0 g/dL   AST 18 15 - 41 U/L   ALT 12 0 - 44 U/L   Alkaline Phosphatase 73 38 - 126 U/L   Total Bilirubin 1.4 (H) 0.3 - 1.2 mg/dL   GFR calc non Af Amer >60 >60 mL/min   GFR calc Af Amer >60 >60 mL/min   Anion gap 10 5 - 15    Comment: Performed at [facility], [address] [place], [place] [address]  CBC     Status: Abnormal   Collection Time: 12/20/18  1:01 PM  Result Value Ref Range   WBC 13.7 (H) 4.0 - 10.5 K/uL   RBC 4.90 3.87 - 5.11 MIL/uL   Hemoglobin 13.8 12.0 - 15.0 g/dL   HCT 42.9 36.0 - 46.0 %   MCV 87.6 80.0 - 100.0 fL   MCH 28.2 26.0 - 34.0 pg   MCHC 32.2 30.0 - 36.0 g/dL   RDW 13.1 11.5 - 15.5 %   Platelets 316 150 - 400 K/uL   nRBC 0.0 0.0 -  0.2 %    Comment: Performed at [facility], [address], [place], [place] [address]  SARS Coronavirus 2 (CEPHEID - Performed in [place] hospital lab), Hosp Order     Status: None   Collection Time: 12/20/18  3:57 PM   Specimen: Nasopharyngeal Swab  Result Value Ref Range   SARS Coronavirus 2 NEGATIVE NEGATIVE    Comment: (NOTE) If result is NEGATIVE SARS-CoV-2 target nucleic acids are NOT DETECTED. The SARS-CoV-2 RNA is generally detectable in upper and lower  respiratory specimens during the acute phase of infection. The lowest  concentration of SARS-CoV-2 viral copies this assay can detect is 250  copies / mL. A negative result does not preclude SARS-CoV-2 infection  and should not be used as the sole basis for treatment or other  patient management decisions.  A negative result may occur with  improper specimen collection / handling, submission of specimen other  than nasopharyngeal swab, presence of viral mutation(s) within the  areas targeted by this assay, and inadequate number of viral copies  (<250 copies / mL). A negative result must be combined with clinical  observations, patient history, and epidemiological information. If result is POSITIVE SARS-CoV-2 target nucleic acids are DETECTED. The SARS-CoV-2 RNA is generally detectable in upper and lower  respiratory specimens dur ing the acute phase of infection.  Positive  results are indicative of active infection with SARS-CoV-2.  Clinical  correlation with patient history and other diagnostic information is  necessary to determine patient infection status.  Positive results do  not rule out bacterial infection or co-infection with other viruses. If result is PRESUMPTIVE POSTIVE SARS-CoV-2 nucleic acids MAY BE PRESENT.   A presumptive positive result was obtained on the submitted specimen  and confirmed on repeat testing.  While 2019 novel coronavirus  (SARS-CoV-2) nucleic acids may be present in the  submitted sample  additional confirmatory testing may be necessary for epidemiological  and / or clinical management purposes  to differentiate between  SARS-CoV-2 and other Sarbecovirus currently known to infect humans.  If clinically indicated additional testing with an alternate test  methodology [phone]) is advised. The SARS-CoV-2 RNA is generally  detectable in upper and lower respiratory sp ecimens during the acute  phase of infection. The expected result is Negative. Fact Sheet for Patients:  StrictlyIdeas.no Fact  Sheet for Healthcare Providers: https://pope.com/https://www.fda.gov/media/136313/download This test is not yet approved or cleared by the [place] FDA and has been authorized for detection and/or diagnosis of SARS-CoV-2 by FDA under an Emergency Use Authorization (EUA).  This EUA will remain in effect (meaning this test can be used) for the duration of the COVID-19 declaration under Section 564(b)(1) of the Act, 21 U.S.C. section 360bbb-3(b)(1), unless the authorization is terminated or revoked sooner. Performed at [facility], [address] [place], [place] [address]    Ct Abdomen Pelvis W Contrast  Result Date: 12/20/2018 CLINICAL DATA:  Abdominal pain and nausea. EXAM: CT ABDOMEN AND PELVIS WITH CONTRAST TECHNIQUE: Multidetector CT imaging of the abdomen and pelvis was performed using the standard protocol following bolus administration of intravenous contrast. CONTRAST:  100mL OMNIPAQUE IOHEXOL 300 MG/ML  SOLN COMPARISON:  None. FINDINGS: Lower chest: Bibasilar scarring type changes but no infiltrates or effusions. The heart is mildly enlarged. No pericardial effusion. Coronary artery and aortic calcifications are noted. There is a moderate-sized hiatal hernia. Hepatobiliary: No focal hepatic lesions or intrahepatic biliary dilatation. The gallbladder appears normal. Mild age related common bile duct dilatation. Pancreas: No mass,  inflammation or ductal dilatation. Moderate-sized duodenal diverticuli are no around the pancreas. Spleen: Normal size.  Small calcified granulomas. Adrenals/Urinary Tract: The adrenal glands and kidneys are unremarkable. No worrisome renal lesions or hydronephrosis. The bladder is unremarkable. There is a small amount of air in the bladder which may be due to recent catheterization or instrumentation. Stomach/Bowel: Findings consistent with an early small bowel obstruction with dilated mid small bowel loops down to a right inguinal hernia. There is inflammation and fluid in the hernia sac along with surrounding interstitial changes. Findings worrisome for incarceration and associated obstruction. The small bowel loop coming out of the hernia is decompressed/normal in caliber and the remaining mid distal small bowel appears normal. Severe colonic diverticulosis without findings for acute diverticulitis. Vascular/Lymphatic: Moderate atherosclerotic calcifications involving the aorta and branch vessels but no aneurysm or dissection. The branch vessels are patent. The major venous structures are patent. Small scattered mesenteric and retroperitoneal lymph nodes but no mass or overt adenopathy. Reproductive: Surgically absent. Other: No free pelvic fluid collections or pelvic abscess. No pelvic adenopathy. No subcutaneous lesions. Musculoskeletal: Age related osteoporosis but no obvious fracture or worrisome bone lesion. IMPRESSION: 1. Early small bowel obstruction due to incarcerated right inguinal hernia. 2. Diffuse colonic diverticulosis without findings for acute diverticulitis. 3. Multiple duodenal diverticuli. 4. Moderate-sized hiatal hernia. Electronically Signed   By: Rudie MeyerP.  Gallerani M.D.   On: 12/20/2018 14:56    Pending Labs Unresulted Labs (From admission, onward)    Start     Ordered   12/21/18 0500  Basic metabolic panel  Tomorrow morning,   STAT     12/20/18 1614   12/21/18 0500  CBC  Tomorrow  morning,   STAT     12/20/18 1614   12/20/18 1330  Urinalysis, Complete w Microscopic  Once,   STAT     12/20/18 1330          Vitals/Pain Today's Vitals   12/20/18 1230 12/20/18 1233 12/20/18 1234 12/20/18 1720  BP:    (!) 162/100  Pulse:    86  Resp:    16  Temp:    98.6 F (37 C)  TempSrc:    Oral  SpO2:    94%  Weight: 59 kg  59 kg   Height: 4\' 8"  (1.422 m)  4\' 8"  (  1.422 m)   PainSc:  5       Isolation Precautions No active isolations  Medications Medications  bisoprolol-hydrochlorothiazide (ZIAC) 10-6.25 MG per tablet 1 tablet (has no administration in time range)  amphetamine-dextroamphetamine (ADDERALL) tablet 10 mg (has no administration in time range)  mirabegron ER (MYRBETRIQ) tablet 25 mg (has no administration in time range)  nitrofurantoin (macrocrystal-monohydrate) (MACROBID) capsule 100 mg (has no administration in time range)  enoxaparin (LOVENOX) injection 40 mg (has no administration in time range)  0.9 %  sodium chloride infusion (has no administration in time range)  piperacillin-tazobactam (ZOSYN) IVPB 3.375 g (has no administration in time range)  acetaminophen (TYLENOL) tablet 650 mg (has no administration in time range)    Or  acetaminophen (TYLENOL) suppository 650 mg (has no administration in time range)  HYDROcodone-acetaminophen (NORCO/VICODIN) 5-325 MG per tablet 1-2 tablet (has no administration in time range)  morphine 2 MG/ML injection 2 mg (has no administration in time range)  ondansetron (ZOFRAN-ODT) disintegrating tablet 4 mg (has no administration in time range)    Or  ondansetron (ZOFRAN) injection 4 mg (has no administration in time range)  pantoprazole (PROTONIX) injection 40 mg (has no administration in time range)  iohexol (OMNIPAQUE) 300 MG/ML solution 100 mL (100 mLs Intravenous Contrast Given 12/20/18 1440)  succinylcholine (ANECTINE) 20 MG/ML injection (has no administration in time range)  lidocaine (XYLOCAINE) 2 % injection  (has no administration in time range)  rocuronium (ZEMURON) 50 MG/5ML injection (has no administration in time range)  ePHEDrine 50 MG/ML injection (has no administration in time range)  phenylephrine (NEO-SYNEPHRINE) 10 MG/ML injection (has no administration in time range)  propofol (DIPRIVAN) 10 mg/mL bolus/IV push (has no administration in time range)  fentaNYL (SUBLIMAZE) 100 MCG/2ML injection (has no administration in time range)  bupivacaine-epinephrine (MARCAINE W/ EPI) 0.25% -1:200000 injection (has no administration in time range)  sevoflurane inhalation liquid (has no administration in time range)    Mobility walks Low fall risk   Focused Assessments    R Recommendations: See Admitting Provider Note  Report given to:   Additional Notes:  Pt to surgery from ED

## 2018-12-21 ENCOUNTER — Other Ambulatory Visit: Payer: Self-pay

## 2018-12-21 ENCOUNTER — Encounter: Payer: Self-pay | Admitting: General Surgery

## 2018-12-21 LAB — BASIC METABOLIC PANEL
Anion gap: 10 (ref 5–15)
BUN: 14 mg/dL (ref 8–23)
CO2: 26 mmol/L (ref 22–32)
Calcium: 8.3 mg/dL — ABNORMAL LOW (ref 8.9–10.3)
Chloride: 101 mmol/L (ref 98–111)
Creatinine, Ser: 0.68 mg/dL (ref 0.44–1.00)
GFR calc Af Amer: >60 mL/min (ref >60)
GFR calc non Af Amer: >60 mL/min (ref >60)
Glucose, Bld: 135 mg/dL — ABNORMAL HIGH (ref 70–99)
Potassium: 3.5 mmol/L (ref 3.5–5.1)
Sodium: 137 mmol/L (ref 135–145)

## 2018-12-21 LAB — CBC
HCT: 40.2 % (ref 36.0–46.0)
Hemoglobin: 13.1 g/dL (ref 12.0–15.0)
MCH: 28.4 pg (ref 26.0–34.0)
MCHC: 32.6 g/dL (ref 30.0–36.0)
MCV: 87 fL (ref 80.0–100.0)
Platelets: 303 10*3/uL (ref 150–400)
RBC: 4.62 MIL/uL (ref 3.87–5.11)
RDW: 13.2 % (ref 11.5–15.5)
WBC: 11.1 10*3/uL — ABNORMAL HIGH (ref 4.0–10.5)
nRBC: 0 % (ref 0.0–0.2)

## 2018-12-21 MED ORDER — POLYETHYLENE GLYCOL 3350 17 G PO PACK
17.0000 g | PACK | Freq: Two times a day (BID) | ORAL | Status: DC
  Administered 2018-12-21 – 2018-12-23 (×4): 17 g via ORAL
  Filled 2018-12-21 (×4): qty 1

## 2018-12-21 MED ORDER — PHENOL 1.4 % MT LIQD
1.0000 | OROMUCOSAL | Status: DC | PRN
  Filled 2018-12-21: qty 177

## 2018-12-21 MED ORDER — SODIUM CHLORIDE 0.9 % IV SOLN
INTRAVENOUS | Status: DC | PRN
  Administered 2018-12-21: 250 mL via INTRAVENOUS

## 2018-12-21 NOTE — Progress Notes (Signed)
[place] Hospital Day(s): 1.   Post op day(s): 1 Day Post-Op.   Interval History: Patient seen and examined, no acute events or new complaints overnight. Patient reports feeling okay today.  Currently pain is under control.  There is some nausea and vomiting.  Patient denies passing rectal gas.   Vital signs in last 24 hours: [min-max] current  Temp:  [97.6 F (36.4 C)-99.5 F (37.5 C)] 99.3 F (37.4 C) (06/22 0533) Pulse Rate:  [80-97] 97 (06/22 0533) Resp:  [16-29] 16 (06/22 0533) BP: (124-162)/(57-100) 131/73 (06/22 0533) SpO2:  [89 %-100 %] 94 % (06/22 0533) Weight:  [59 kg] 59 kg (06/21 1234)     Height: 4\' 8"  (142.2 cm) Weight: 59 kg BMI (Calculated): 29.18   Physical Exam:  Constitutional: alert, cooperative and no distress  Respiratory: breathing non-labored at rest  Cardiovascular: regular rate and sinus rhythm  Gastrointestinal: soft, non-tender, and non-distended.  Wound is dry   Labs:  CBC Latest Ref Rng & Units 12/21/2018 12/20/2018 02/27/2009  WBC 4.0 - 10.5 K/uL 11.1(H) 13.7(H) 5.8  Hemoglobin 12.0 - 15.0 g/dL 13.1 13.8 14.0  Hematocrit 36.0 - 46.0 % 40.2 42.9 41.5  Platelets 150 - 400 K/uL 303 316 242   CMP Latest Ref Rng & Units 12/21/2018 12/20/2018 10/12/2010  Glucose 70 - 99 mg/dL 135(H) 148(H) 103(H)  BUN 8 - 23 mg/dL 14 12 18   Creatinine 0.44 - 1.00 mg/dL 0.68 0.77 0.84  Sodium 135 - 145 mmol/L 137 134(L) 140  Potassium 3.5 - 5.1 mmol/L 3.5 4.0 4.2  Chloride 98 - 111 mmol/L 101 96(L) 102  CO2 22 - 32 mmol/L 26 28 27   Calcium 8.9 - 10.3 mg/dL 8.3(L) 9.5 9.5  Total Protein 6.5 - 8.1 g/dL - 7.1 -  Total Bilirubin 0.3 - 1.2 mg/dL - 1.4(H) -  Alkaline Phos 38 - 126 U/L - 73 -  AST 15 - 41 U/L - 18 -  ALT 0 - 44 U/L - 12 -    Imaging studies: No new pertinent imaging studies   Assessment/Plan:  78 y.o. female with strangulated inguinal hernia 1 Day Post-Op s/p small bowel resection with anastomosis and primary inguinal hernia repair,  complicated by pertinent comorbidities including hypertension Chiari malformation, hyperlipidemia and right bundle blanch block.  Patient recovering slowly.  She still not passing gas.  She has small bowel obstruction that was fixed with the repair of the hernia but may developed an ileus.  NGT in place.  Will follow clinically until bowel function returns.  We will continue with pain management.  We will continue with prophylaxis.  There is no fever and no tachycardia.  Labs shows improved leukocytosis, stable hemoglobin and no significant electrolyte disturbance.  Arnold Long, MD

## 2018-12-22 LAB — SURGICAL PATHOLOGY

## 2018-12-22 MED ORDER — MINERAL OIL PO OIL
30.0000 mL | TOPICAL_OIL | Freq: Once | ORAL | Status: AC
  Administered 2018-12-22: 30 mL via ORAL
  Filled 2018-12-22: qty 30

## 2018-12-22 NOTE — Progress Notes (Signed)
[place] Hospital Day(s): 2.   Post op day(s): 2 Days Post-Op.   Interval History: Patient seen and examined, no acute events or new complaints overnight. Patient reports feeling miserable because she is laying down all day in bed.  She denies significant pain on the right groin area.  She reports passing gas yesterday but there is no bowel movement.  Vital signs in last 24 hours: [min-max] current  Temp:  [98.6 F (37 C)-99.5 F (37.5 C)] 98.6 F (37 C) (06/23 0412) Pulse Rate:  [74-76] 76 (06/23 0412) Resp:  [20] 20 (06/23 0412) BP: (141-146)/(68-83) 141/83 (06/23 0412) SpO2:  [91 %-93 %] 93 % (06/23 0412)     Height: 4\' 8"  (142.2 cm) Weight: 59 kg BMI (Calculated): 29.18    Physical Exam:  Constitutional: alert, cooperative and no distress  Respiratory: breathing non-labored at rest  Cardiovascular: regular rate and sinus rhythm  Gastrointestinal: soft, non-tender, and non-distended.  Wound is dry and clean  Labs:  CBC Latest Ref Rng & Units 12/21/2018 12/20/2018 02/27/2009  WBC 4.0 - 10.5 K/uL 11.1(H) 13.7(H) 5.8  Hemoglobin 12.0 - 15.0 g/dL 13.1 13.8 14.0  Hematocrit 36.0 - 46.0 % 40.2 42.9 41.5  Platelets 150 - 400 K/uL 303 316 242   CMP Latest Ref Rng & Units 12/21/2018 12/20/2018 10/12/2010  Glucose 70 - 99 mg/dL 135(H) 148(H) 103(H)  BUN 8 - 23 mg/dL 14 12 18   Creatinine 0.44 - 1.00 mg/dL 0.68 0.77 0.84  Sodium 135 - 145 mmol/L 137 134(L) 140  Potassium 3.5 - 5.1 mmol/L 3.5 4.0 4.2  Chloride 98 - 111 mmol/L 101 96(L) 102  CO2 22 - 32 mmol/L 26 28 27   Calcium 8.9 - 10.3 mg/dL 8.3(L) 9.5 9.5  Total Protein 6.5 - 8.1 g/dL - 7.1 -  Total Bilirubin 0.3 - 1.2 mg/dL - 1.4(H) -  Alkaline Phos 38 - 126 U/L - 73 -  AST 15 - 41 U/L - 18 -  ALT 0 - 44 U/L - 12 -    Imaging studies: No new pertinent imaging studies   Assessment/Plan:  78 y.o. female with strangulated inguinal hernia 2 Day Post-Op s/p small bowel resection with anastomosis and primary  inguinal hernia repair, complicated by pertinent comorbidities including hypertension Chiari malformation, hyperlipidemia and right bundle blanch block. Patient recovering slowly.  Received no complaints of bowel function.  We will continue with NGT and IV fluids.  Will aid patient with bowel movement using MiraLAX and mineral oil.  I encouraged the patient to get out of bed.  I talked to the nurse to help her mobilizing out of bed.  There is no fever and no tachycardia.  There is no nausea or vomiting.  There is minimal output per NGT.  Hopefully will be able to take the NGT out soon.  Arnold Long, MD

## 2018-12-22 NOTE — Anesthesia Postprocedure Evaluation (Signed)
Anesthesia Post Note  Patient: Lorraine Ferguson  Procedure(s) Performed: HERNIA REPAIR INGUINAL INCARCERATED (Right )  Patient location during evaluation: PACU Anesthesia Type: General Level of consciousness: awake and alert and oriented Pain management: pain level controlled Vital Signs Assessment: post-procedure vital signs reviewed and stable Respiratory status: spontaneous breathing Cardiovascular status: blood pressure returned to baseline Anesthetic complications: no     Last Vitals:  Vitals:   12/22/18 0412 12/22/18 1330  BP: (!) 141/83 (!) 150/72  Pulse: 76 71  Resp: 20 17  Temp: 37 C 37.1 C  SpO2: 93% 93%    Last Pain:  Vitals:   12/22/18 1330  TempSrc: Oral  PainSc:                  [name]

## 2018-12-23 MED ORDER — POLYETHYLENE GLYCOL 3350 17 G PO PACK
17.0000 g | PACK | Freq: Every day | ORAL | Status: DC
  Filled 2018-12-23: qty 1

## 2018-12-23 NOTE — Care Management Important Message (Signed)
Important Message  Patient Details  Name: Lorraine Ferguson MRN: [id number] Date of Birth: 09-Nov-1940   Medicare Important Message Given:  Yes     Dannette Barbara 12/23/2018, 10:57 AM

## 2018-12-23 NOTE — Progress Notes (Signed)
NG tube taken out per MD order. Pt has tolerated clear liquid with breakfast and lunch.

## 2018-12-23 NOTE — Addendum Note (Signed)
Addendum  created 12/23/18 1046 by Doreen Salvage, CRNA   Charge Capture section accepted

## 2018-12-23 NOTE — Progress Notes (Addendum)
[place] Hospital Day(s): 3.   Post op day(s): 3 Days Post-Op.   Interval History: Patien seen and examined, no acute events or new complaints overnight. Patient reports feeling better today. She report having two large bowel movements last night. This morning she was started on clear liquid diet and tolerated. NGT removed.   Vital signs in last 24 hours: [min-max] current  Temp:  [97.5 F (36.4 C)-99 F (37.2 C)] 97.5 F (36.4 C) (06/24 1225) Pulse Rate:  [61-74] 61 (06/24 1225) Resp:  [20] 20 (06/24 1225) BP: (142-160)/(66-74) 142/66 (06/24 1225) SpO2:  [93 %-97 %] 94 % (06/24 1225)     Height: 4\' 8"  (142.2 cm) Weight: 59 kg BMI (Calculated): 29.18   Physical Exam:  Constitutional: alert, cooperative and no distress  Respiratory: breathing non-labored at rest  Cardiovascular: regular rate and sinus rhythm  Gastrointestinal: soft, non-tender, and non-distended. Wound is dry and clean.   Labs:  CBC Latest Ref Rng & Units 12/21/2018 12/20/2018 02/27/2009  WBC 4.0 - 10.5 K/uL 11.1(H) 13.7(H) 5.8  Hemoglobin 12.0 - 15.0 g/dL 13.1 13.8 14.0  Hematocrit 36.0 - 46.0 % 40.2 42.9 41.5  Platelets 150 - 400 K/uL 303 316 242   CMP Latest Ref Rng & Units 12/21/2018 12/20/2018 10/12/2010  Glucose 70 - 99 mg/dL 135(H) 148(H) 103(H)  BUN 8 - 23 mg/dL 14 12 18   Creatinine 0.44 - 1.00 mg/dL 0.68 0.77 0.84  Sodium 135 - 145 mmol/L 137 134(L) 140  Potassium 3.5 - 5.1 mmol/L 3.5 4.0 4.2  Chloride 98 - 111 mmol/L 101 96(L) 102  CO2 22 - 32 mmol/L 26 28 27   Calcium 8.9 - 10.3 mg/dL 8.3(L) 9.5 9.5  Total Protein 6.5 - 8.1 g/dL - 7.1 -  Total Bilirubin 0.3 - 1.2 mg/dL - 1.4(H) -  Alkaline Phos 38 - 126 U/L - 73 -  AST 15 - 41 U/L - 18 -  ALT 0 - 44 U/L - 12 -    Imaging studies: No new pertinent imaging studies   Assessment/Plan:  78 y.o.femalewith strangulated inguinal hernia3 Day Post-Ops/p small bowel resection with anastomosis and primary inguinal hernia repair,  complicated by pertinent comorbidities includinghypertension Chiari malformation, hyperlipidemia and right bundle blanch block. Patient recovering slowly. Tolerated NGT clamped during the night. Had two large bowel movements. This morning started on clear liquids and tolerated. NGT removed and will advance diet to full liquid. Wound dry and clean. No fever or tachycardia. Will continue DVT prophylaxis and incentive spirometer. Will continue IV antibiotic therapy.   Husband updated of patient recovery and status.   Arnold Long, MD

## 2018-12-24 NOTE — Progress Notes (Signed)
Discharge order received. Patient mental status is at baseline. Vital signs stable . No signs of acute distress. Discharge instructions given. Patient verbalized understanding. No other issues noted at this time.   

## 2018-12-24 NOTE — Discharge Summary (Signed)
  Patient ID: Lorraine Ferguson MRN: [id number] DOB/AGE: 78-28-1942 78 y.o.  Admit date: 12/20/2018 Discharge date: 12/24/2018   Discharge Diagnoses:  Active Problems:   Incarcerated inguinal hernia, unilateral   Procedures: Right inguinal hernia repair.  Bowel resection anastomosis  Hospital Course: Patient with a stimulator right knee and hernia.  She underwent small bowel resection with anastomosis with primary repair of the inguinal hernia.  She tolerated procedure well.  Due to the strangulation she had bowel obstruction that was treated with nasogastric tube and IV fluid.  She started passing gas and had a bowel movement.  Diet was advanced to soft diet as tolerated.  Patient today passing gas, no pain, able to get out of bed.  Physical Exam  Constitutional: She is well-developed, well-nourished, and in no distress.  HENT:  Head: Normocephalic.  Cardiovascular: Normal rate and regular rhythm.  Pulmonary/Chest: Effort normal.  Abdominal: Soft. Bowel sounds are normal. She exhibits no distension. There is no abdominal tenderness. There is no rebound.  Right inguinal wound dry and clean with staples in place, no erythema, no secretions.   Consults: None  Disposition: Discharge disposition: 01-Home or Self Care       Discharge Instructions    Diet - low sodium heart healthy   Complete by: As directed      Allergies as of 12/24/2018      Reactions   Levofloxacin    Caused arm pain.      Medication List    TAKE these medications   amphetamine-dextroamphetamine 10 MG tablet Commonly known as: ADDERALL Take 10 mg by mouth 2 (two) times a day.   aspirin 325 MG tablet Take 325 mg by mouth daily.   bisoprolol-hydrochlorothiazide 10-6.25 MG tablet Commonly known as: ZIAC Take 1 tablet by mouth daily.   conjugated estrogens vaginal cream Commonly known as: Premarin Place 1 Applicatorful vaginally daily. Use pea sized amount M-W-Fr before bedtime   cyanocobalamin 100  MCG tablet Take 100 mcg by mouth daily.   levothyroxine 25 MCG tablet Commonly known as: SYNTHROID Take 25 mcg by mouth daily before breakfast.   mirabegron ER 25 MG Tb24 tablet Commonly known as: MYRBETRIQ Take 1 tablet (25 mg total) by mouth daily.   nitrofurantoin (macrocrystal-monohydrate) 100 MG capsule Commonly known as: MACROBID Take 1 capsule (100 mg total) by mouth 2 (two) times daily.      Follow-up Information    Herbert Pun, MD Follow up in 2 week(s).   Specialty: General Surgery Contact information: [address] [place] [address] [phone]

## 2018-12-24 NOTE — Discharge Instructions (Signed)
°  Diet: Resume home heart healthy regular diet.   Activity: No heavy lifting >20 pounds (children, pets, laundry, garbage) or strenuous activity until follow-up, but light activity and walking are encouraged. Do not drive or drink alcohol if taking narcotic pain medications.  Wound care: May shower with soapy water and pat dry (do not rub incisions), but no baths or submerging incision underwater until follow-up. (no swimming)   Medications: Resume all home medications. For mild to moderate pain: acetaminophen (Tylenol) or ibuprofen (if no kidney disease). Combining Tylenol with alcohol can substantially increase your risk of causing liver disease.   May put ice pack on the wound if feeling sore or swollen.   Call office [phone]) at any time if any questions, worsening pain, fevers/chills, bleeding, drainage from incision site, or other concerns.

## 2019-04-16 NOTE — Progress Notes (Addendum)
Cardiology Office Note  Date:  04/19/2019   ID:  RUT BETTERTON, DOB [birth year], MRN [id number]  PCP:  Idelle Crouch, MD   Chief Complaint  Patient presents with  . other    Dr. Doy Hutching sent for evaluation of new abnormal EKG of RBBB. Meds reviewed by the pt. verbally. Pt. c/o leg tiredness and feeling fatigue most days.     HPI:  78 yo  woman with history of  HTN  Depression hyperparathyroidism s/p parathyroidectomy 9/10,    ABN EKG with RBBB and left anterior fascicular block on her ECG chronic fatigue despite once daily Adderall.  Chiari I malformation (CMS-HCC)  presents for routine followup of her hypertension and tachycardia  Fatigue and malaise Depression Legs getting weaker  BP elevated today Well controlled with Dr. Doy Hutching  Recent leg edema, lasted couple weeks "loves salt" Resolved now  Bladder problem, wears pads  Thinks having right groin discomo, worried about hernia  Home BP 150 to 419 Here 379 systolic x2  Total chol 024, LDL 100  EKG personally reviewed by myself on todays visit NSR rate 60 bpm, RBBB, LAFB  Other past medical history  Previously reported that  food was sticking when she swallows.  EGD and colonoscopy with Dr. Vira Agar 04/08/2013.  She has no new complaints.   PMH:   has a past medical history of Chiari malformation, Depression, Fibrocystic breast disease, HTN (hypertension), Hyperparathyroidism, Normal echocardiogram, RBBB (right bundle branch block with left anterior fascicular block), and Recurrent UTI.  PSH:    Past Surgical History:  Procedure Laterality Date  . ABDOMINAL HYSTERECTOMY    . chiari malformation    . hysterectomy (other)    . INGUINAL HERNIA REPAIR Right 12/20/2018   Procedure: HERNIA REPAIR INGUINAL INCARCERATED;  Surgeon: Herbert Pun, MD;  Location: [facility] ORS;  Service: General;  Laterality: Right;  . leg vein    . parathyroid surgery      Current Outpatient Medications  Medication Sig  Dispense Refill  . amphetamine-dextroamphetamine (ADDERALL) 10 MG tablet Take 10 mg by mouth 2 (two) times a day.    Marland Kitchen aspirin 325 MG tablet Take 325 mg by mouth daily.    . bisoprolol-hydrochlorothiazide (ZIAC) 10-6.25 MG tablet Take 1 tablet by mouth daily.    Marland Kitchen conjugated estrogens (PREMARIN) vaginal cream Place 1 Applicatorful vaginally daily. Use pea sized amount M-W-Fr before bedtime 42.5 g 12  . cyanocobalamin 100 MCG tablet Take 100 mcg by mouth daily.     No current facility-administered medications for this visit.     Allergies:   Levofloxacin   Social History:  The patient  reports that she has never smoked. She has never used smokeless tobacco. She reports that she does not drink alcohol or use drugs.   Family History:   family history includes Alzheimer's disease in her mother; Breast cancer in her mother; Heart failure in her father; Stroke in her mother.    Review of Systems: Review of Systems  Constitutional: Negative.   Respiratory: Negative.   Cardiovascular: Negative.   Gastrointestinal: Negative.   Musculoskeletal: Negative.   Neurological: Negative.   Psychiatric/Behavioral: Positive for depression.  All other systems reviewed and are negative.   PHYSICAL EXAM: VS:  BP (!) 180/90 (BP Location: Left Arm, Patient Position: Sitting, Cuff Size: Normal)   Pulse 60   Ht 4\' 11"  (1.499 m)   Wt 129 lb 8 oz (58.7 kg)   BMI 26.16 kg/m  , BMI Body mass index is  26.16 kg/m. GEN: Well nourished, well developed, in no acute distress  HEENT: normal  Neck: no JVD, carotid bruits, or masses Cardiac: RRR; 1+ LSB,  No rubs, or gallops,no edema  Respiratory:  clear to auscultation bilaterally, normal work of breathing GI: soft, nontender, nondistended, + BS MS: no deformity or atrophy  Skin: warm and dry, no rash Neuro:  Strength and sensation are intact Psych: euthymic mood, full affect   Recent Labs: 12/20/2018: ALT 12 12/21/2018: BUN 14; Creatinine, Ser 0.68;  Hemoglobin 13.1; Platelets 303; Potassium 3.5; Sodium 137    Lipid Panel Lab Results  Component Value Date   CHOL 195 05/09/2009   HDL 58 05/09/2009   LDLCALC 120 (H) 05/09/2009   TRIG 87 05/09/2009      Wt Readings from Last 3 Encounters:  04/19/19 129 lb 8 oz (58.7 kg)  12/20/18 130 lb 1.1 oz (59 kg)  07/15/17 130 lb (59 kg)       ASSESSMENT AND PLAN:  Essential hypertension - Plan: EKG 12-Lead Markedly elevated blood pressure on today's visit, reports it is always elevated at home but typically 150 up to 160 systolic She reports blood pressure numbers from primary care are never accurate, it is always higher 180 in the office even on my recheck We recommended she start losartan 50 mg daily Stay on her current medication bisoprolol HCT  Abnormal EKG - Plan: EKG 12-Lead Right bundle branch block No further work-up, stable  Chronic fatigue - Plan: EKG 12-Lead Sedentary lifestyle, depression  Obesity Recommended dietary changes Recommended walking program  Leg swelling Etiology unclear, reports it resolved on her own She was not forthcoming about details of whether she was standing for prolonged periods of time or sitting She does not want echocardiogram or Lasix at this time Recommended if symptoms come back she call the office, echocardiogram and Lasix  need be needed   Disposition:   F/U  12 months as needed   No orders of the defined types were placed in this encounter.   Total encounter time more than 25 minutes  Greater than 50% was spent in counseling and coordination of care with the patient   Signed, Dossie Arbour, M.D., Ph.D. 04/19/2019  [facility] [place], [place] [phone]

## 2019-04-19 ENCOUNTER — Ambulatory Visit (INDEPENDENT_AMBULATORY_CARE_PROVIDER_SITE_OTHER): Payer: Medicare Other | Admitting: Cardiovascular Disease

## 2019-04-19 ENCOUNTER — Encounter: Payer: Self-pay | Admitting: Cardiovascular Disease

## 2019-04-19 ENCOUNTER — Other Ambulatory Visit: Payer: Self-pay

## 2019-04-19 VITALS — BP 180/90 | HR 60 | Ht 59.0 in | Wt 129.5 lb

## 2019-04-19 DIAGNOSIS — I479 Paroxysmal tachycardia, unspecified: Secondary | ICD-10-CM | POA: Diagnosis not present

## 2019-04-19 DIAGNOSIS — I10 Essential (primary) hypertension: Secondary | ICD-10-CM

## 2019-04-19 DIAGNOSIS — R9431 Abnormal electrocardiogram [ECG] [EKG]: Secondary | ICD-10-CM | POA: Diagnosis not present

## 2019-04-19 MED ORDER — ASPIRIN EC 81 MG PO TBEC: 81.0000 mg | DELAYED_RELEASE_TABLET | Freq: Every day | ORAL | Status: AC

## 2019-04-19 MED ORDER — LOSARTAN POTASSIUM 50 MG PO TABS: 50.0000 mg | ORAL_TABLET | Freq: Every day | ORAL | 3 refills | Status: DC

## 2019-04-19 NOTE — Patient Instructions (Addendum)
If you have leg swelling again, Call the office Would could do an echocardiogram Also could start lasix/furosemide diuretic as needed  Medication Instructions:  - Your physician has recommended you make the following change in your medication:   1) Decrease aspirin down to 81 mg- take 1 tablet by mouth once daily  2) Start losartan 50 mg- take 1 tablet by mouth once daily for blood pressure  - Please call with blood pressure numbers after 7-10 days, goal <140 (top number) - Please take your readings about 1-2 hours after you take your morning medications  If you need a refill on your cardiac medications before your next appointment, please call your pharmacy.    Lab work: No new labs needed   If you have labs (blood work) drawn today and your tests are completely normal, you will receive your results only by: Marland Kitchen MyChart Message (if you have MyChart) OR . A paper copy in the mail If you have any lab test that is abnormal or we need to change your treatment, we will call you to review the results.   Testing/Procedures: No new testing needed   Follow-Up: At [facility], you and your health needs are our priority.  As part of our continuing mission to provide you with exceptional heart care, we have created designated Provider Care Teams.  These Care Teams include your primary Cardiologist (physician) and Advanced Practice Providers (APPs -  Physician Assistants and Nurse Practitioners) who all work together to provide you with the care you need, when you need it.  . You will need a follow up appointment in 6 months   . Providers on your designated Care Team:   . Murray Hodgkins, NP . Christell Faith, PA-C . Marrianne Mood, PA-C  Any Other Special Instructions Will Be Listed Below (If Applicable).  For educational health videos Log in to : [email or web address] Or : [email or web address], password : [email or web address]

## 2019-10-15 ENCOUNTER — Other Ambulatory Visit: Payer: Self-pay | Admitting: Physician Assistant

## 2019-10-15 ENCOUNTER — Ambulatory Visit
Admission: RE | Admit: 2019-10-15 | Discharge: 2019-10-15 | Disposition: A | Discharge status: Home / Self Care | Payer: Medicare Other | Source: Ambulatory Visit | Attending: Physician Assistant | Admitting: Physician Assistant

## 2019-10-15 ENCOUNTER — Other Ambulatory Visit: Payer: Self-pay

## 2019-10-15 DIAGNOSIS — M7989 Other specified soft tissue disorders: Secondary | ICD-10-CM

## 2020-01-25 IMAGING — CT CT ABDOMEN AND PELVIS WITH CONTRAST
2 of 5 series · 15 of 46 positions shown, 17 images · IV contrast (APPLIED)
Comparison: None.

CLINICAL DATA: Abdominal pain and nausea.

EXAM:
CT ABDOMEN AND PELVIS WITH CONTRAST
TECHNIQUE: Multidetector CT imaging of the abdomen and pelvis was performed
using the standard protocol following bolus administration of
intravenous contrast.
CONTRAST:  100mL OMNIPAQUE IOHEXOL 300 MG/ML  SOLN

[Series 2: routine abd/pel with · axial · 0.82mm/px · z∈[-378,+32]mm · 12 of 92 slices shown, 14 images]
[im 5/92  soft-tissue]
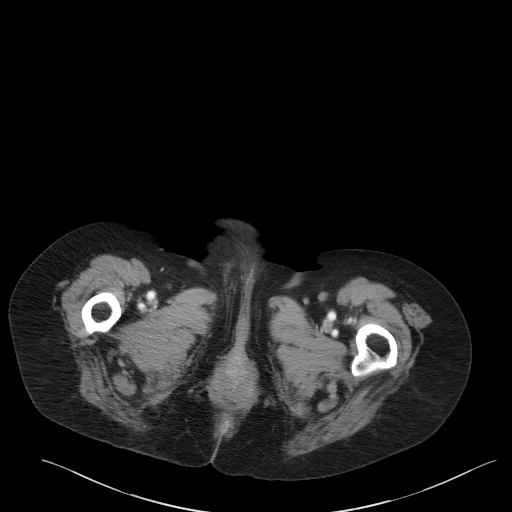
[im 5/92  bone]
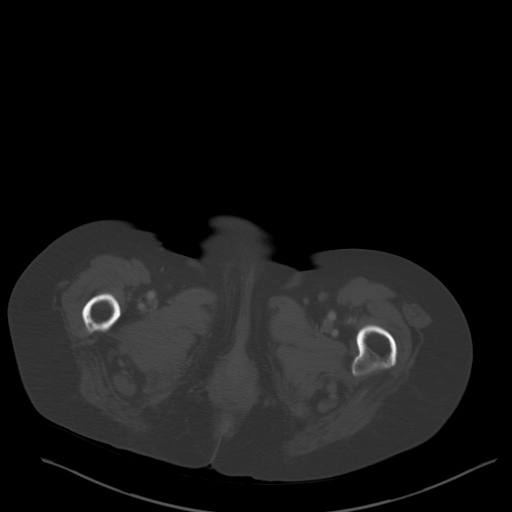
[im 14/92  soft-tissue]
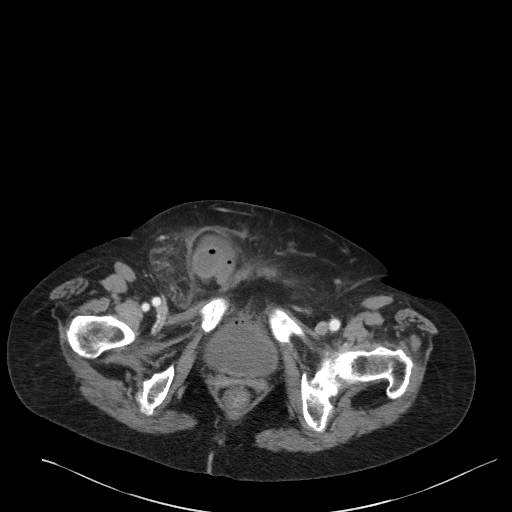
[im 19/92  soft-tissue]
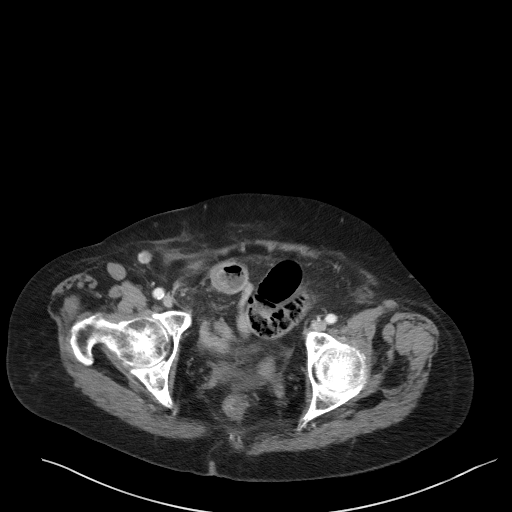
[im 28/92  soft-tissue]
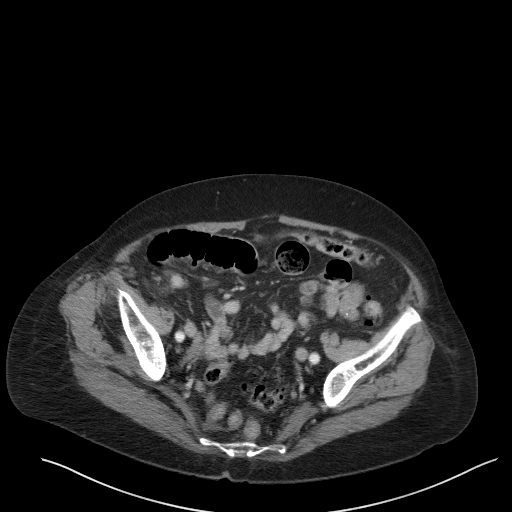
[im 37/92  soft-tissue]
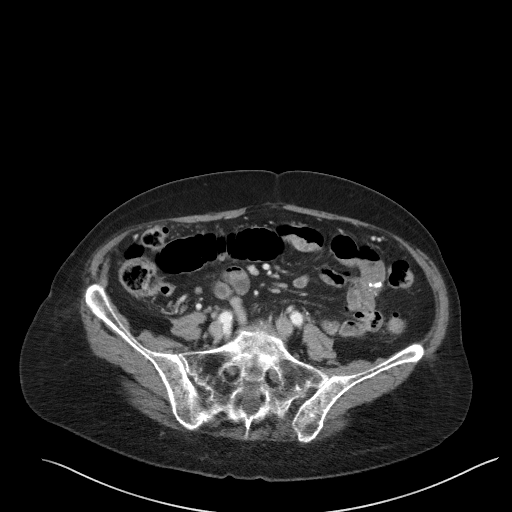
[im 41/92  soft-tissue]
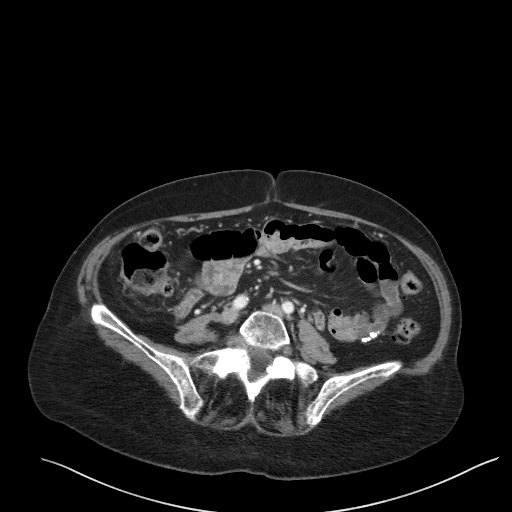
[im 51/92  soft-tissue]
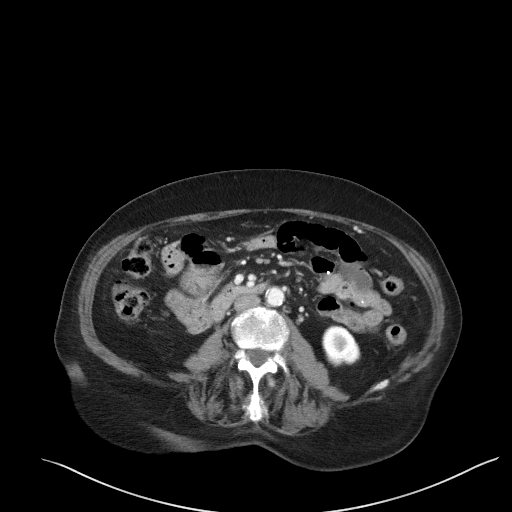
[im 55/92  soft-tissue]
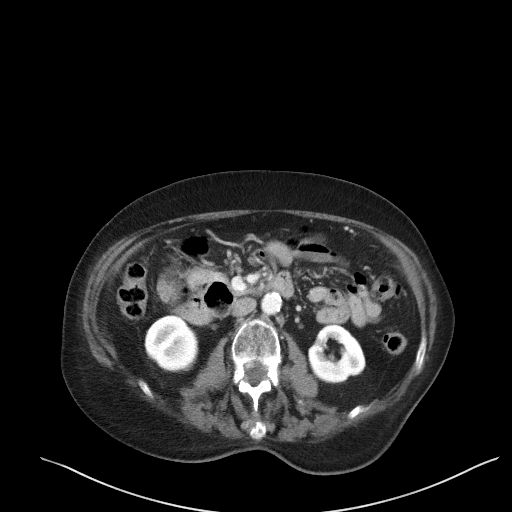
[im 64/92  soft-tissue]
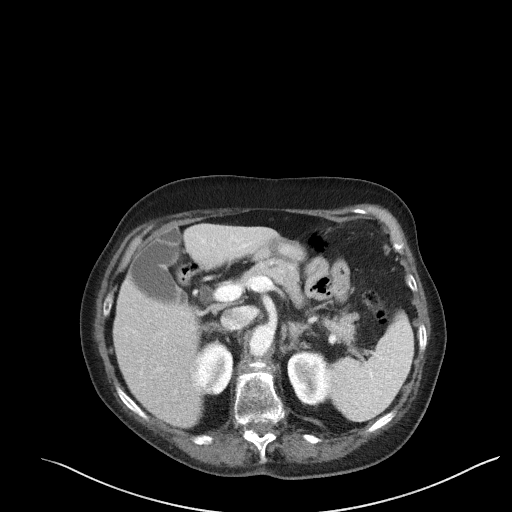
[im 64/92  bone]
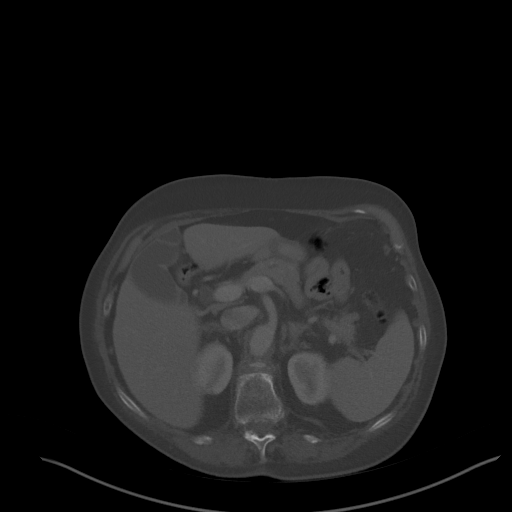
[im 73/92  soft-tissue]
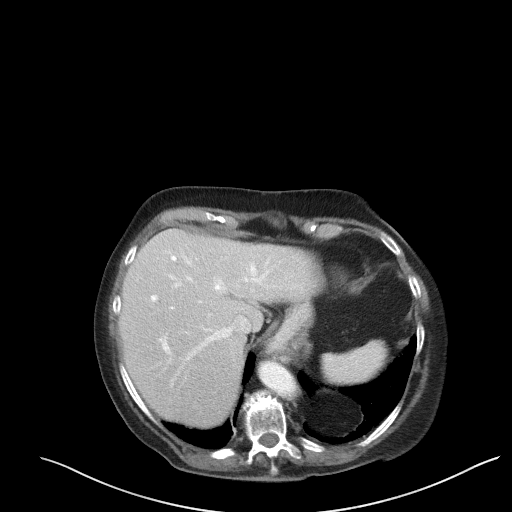
[im 78/92  soft-tissue]
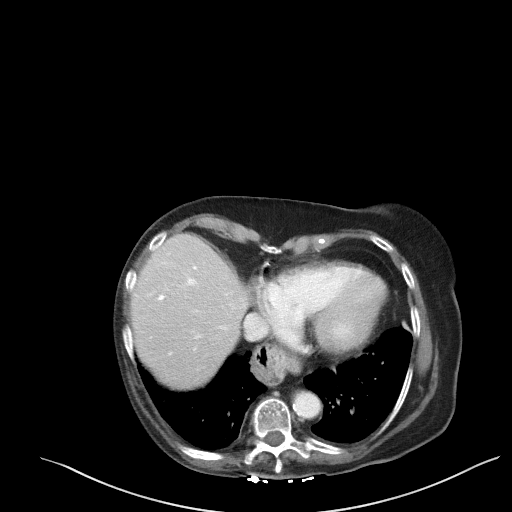
[im 87/92  soft-tissue]
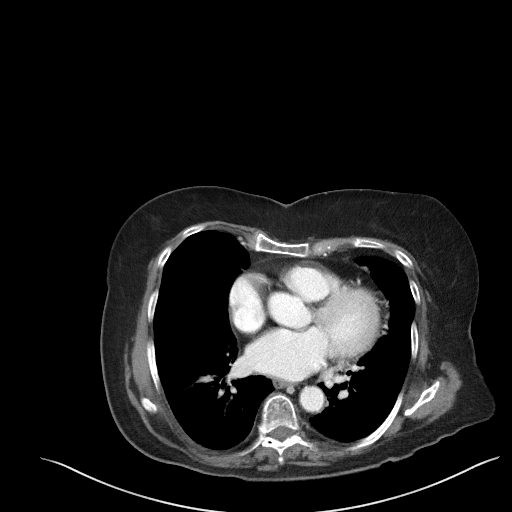

[Series 5: coronal st · coronal · 0.77mm/px · 3 of 82 slices shown]
[im 28/82  soft-tissue]
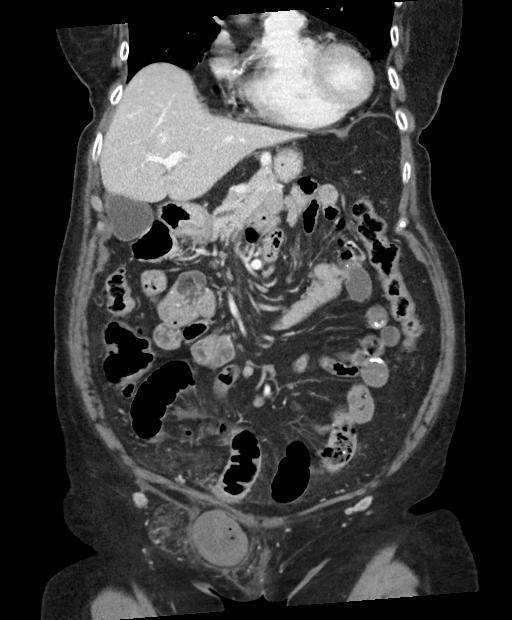
[im 37/82  soft-tissue]
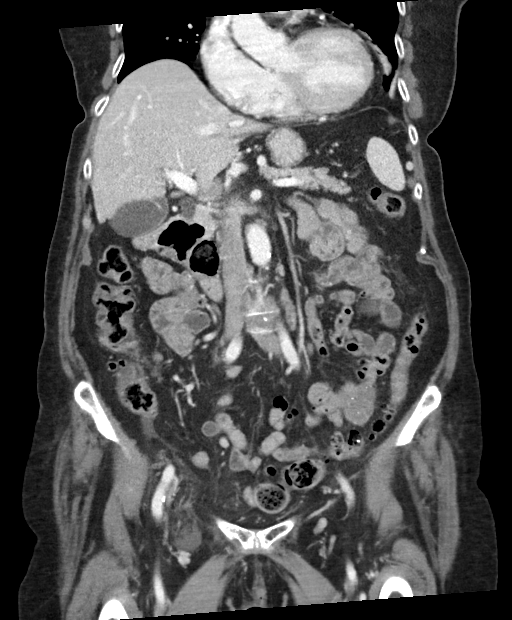
[im 46/82  soft-tissue]
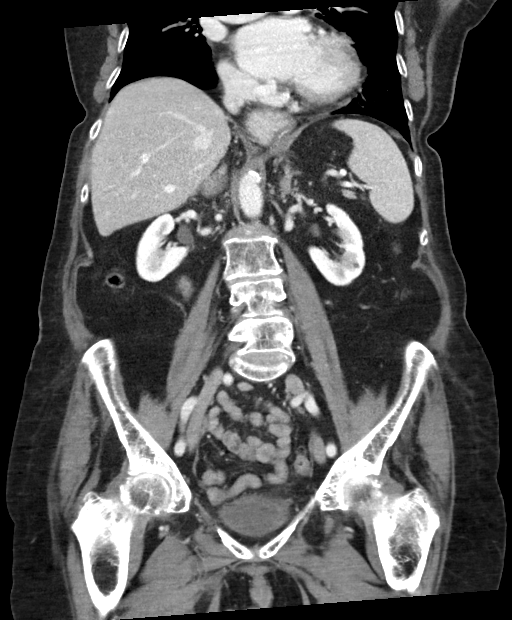

[15 of 46 positions shown; findings below may reference images not displayed]

FINDINGS: Lower chest: Bibasilar scarring type changes but no infiltrates or
effusions. The heart is mildly enlarged. No pericardial effusion.
Coronary artery and aortic calcifications are noted. There is a
moderate-sized hiatal hernia.

Hepatobiliary: No focal hepatic lesions or intrahepatic biliary
dilatation. The gallbladder appears normal. Mild age related common
bile duct dilatation.

Pancreas: No mass, inflammation or ductal dilatation. Moderate-sized
duodenal diverticuli are no around the pancreas.

Spleen: Normal size.  Small calcified granulomas.

Adrenals/Urinary Tract: The adrenal glands and kidneys are
unremarkable. No worrisome renal lesions or hydronephrosis. The
bladder is unremarkable. There is a small amount of air in the
bladder which may be due to recent catheterization or
instrumentation.

Stomach/Bowel: Findings consistent with an early small bowel
obstruction with dilated mid small bowel loops down to a right
inguinal hernia. There is inflammation and fluid in the hernia sac
along with surrounding interstitial changes. Findings worrisome for
incarceration and associated obstruction. The small bowel loop
coming out of the hernia is decompressed/normal in caliber and the
remaining mid distal small bowel appears normal.

Severe colonic diverticulosis without findings for acute
diverticulitis.

Vascular/Lymphatic: Moderate atherosclerotic calcifications
involving the aorta and branch vessels but no aneurysm or
dissection. The branch vessels are patent. The major venous
structures are patent.

Small scattered mesenteric and retroperitoneal lymph nodes but no
mass or overt adenopathy.

Reproductive: Surgically absent.

Other: No free pelvic fluid collections or pelvic abscess. No pelvic
adenopathy. No subcutaneous lesions.

Musculoskeletal: Age related osteoporosis but no obvious fracture or
worrisome bone lesion.
IMPRESSION: 1. Early small bowel obstruction due to incarcerated right inguinal
hernia.
2. Diffuse colonic diverticulosis without findings for acute
diverticulitis.
3. Multiple duodenal diverticuli.
4. Moderate-sized hiatal hernia.

## 2020-05-18 ENCOUNTER — Ambulatory Visit: Payer: Medicare Other | Admitting: Physician Assistant

## 2020-07-18 ENCOUNTER — Ambulatory Visit: Payer: Medicare Other | Admitting: Cardiovascular Disease

## 2020-08-15 ENCOUNTER — Other Ambulatory Visit: Payer: Self-pay

## 2020-08-15 ENCOUNTER — Encounter: Payer: Self-pay | Admitting: Cardiovascular Disease

## 2020-08-15 ENCOUNTER — Ambulatory Visit (INDEPENDENT_AMBULATORY_CARE_PROVIDER_SITE_OTHER): Payer: Medicare Other | Admitting: Cardiovascular Disease

## 2020-08-15 VITALS — BP 160/80 | HR 68 | Ht 62.0 in | Wt 133.5 lb

## 2020-08-15 DIAGNOSIS — R000 Tachycardia, unspecified: Secondary | ICD-10-CM

## 2020-08-15 DIAGNOSIS — I10 Essential (primary) hypertension: Secondary | ICD-10-CM

## 2020-08-15 DIAGNOSIS — R5382 Chronic fatigue, unspecified: Secondary | ICD-10-CM | POA: Diagnosis not present

## 2020-08-15 MED ORDER — AMLODIPINE BESYLATE 5 MG PO TABS: 5.0000 mg | ORAL_TABLET | Freq: Every day | ORAL | 3 refills | Status: DC

## 2020-08-15 NOTE — Patient Instructions (Signed)
Medication Instructions:  Retry norvasc /amlodipine 5 mg daily  If you need a refill on your cardiac medications before your next appointment, please call your pharmacy.    Lab work: No new labs needed   If you have labs (blood work) drawn today and your tests are completely normal, you will receive your results only by: Marland Kitchen MyChart Message (if you have MyChart) OR . A paper copy in the mail If you have any lab test that is abnormal or we need to change your treatment, we will call you to review the results.   Testing/Procedures: No new testing needed   Follow-Up: At [facility], you and your health needs are our priority.  As part of our continuing mission to provide you with exceptional heart care, we have created designated Provider Care Teams.  These Care Teams include your primary Cardiologist (physician) and Advanced Practice Providers (APPs -  Physician Assistants and Nurse Practitioners) who all work together to provide you with the care you need, when you need it.  . You will need a follow up appointment in 12 months  . Providers on your designated Care Team:   . Nicolasa Ducking, NP . Eula Listen, PA-C . Marisue Ivan, PA-C  Any Other Special Instructions Will Be Listed Below (If Applicable).  COVID-19 Vaccine Information can be found at: [email or web address].nl For questions related to vaccine distribution or appointments, please email [email or web address] or call [phone].

## 2020-08-15 NOTE — Progress Notes (Signed)
Cardiology Office Note  Date:  08/15/2020   ID:  Lorraine Ferguson, DOB 11/23/1940, MRN [id number]  PCP:  Marguarite Arbour, MD   Chief Complaint  Patient presents with  . Other    OD 6 month c/o elevated BP. Meds reviewed verbally with pt.    HPI:  80 yo  woman with history of  HTN  Depression hyperparathyroidism s/p parathyroidectomy 9/10,    ABN EKG with RBBB and left anterior fascicular block on her ECG chronic fatigue despite once daily Adderall.  Chiari I malformation (CMS-HCC)  presents for routine followup of her hypertension and tachycardia  Last seen in clinic October 2020 At that time with depression, fatigue, malaise, legs giving nuclear  Seen by primary care December 2021 Blood pressure elevated on that visit 184/90 Amlodipine was added She stopped her other medications x2 Was running high without other meds, stopped it  Rib fractures on x-ray from fall  Prior leg swelling in the setting of high salt intake Bladder problem, wears pads  Lab work reviewed Creatinine 0.7 Total cholesterol 177 LDL around 100  EKG personally reviewed by myself on todays visit NSR rate 60 bpm, RBBB, LAFB  Other past medical history  Previously reported that  food was sticking when she swallows.  EGD and colonoscopy with Dr. Mechele Collin 04/08/2013.  She has no new complaints.   PMH:   has a past medical history of Chiari malformation, Depression, Fibrocystic breast disease, HTN (hypertension), Hyperparathyroidism, Normal echocardiogram, RBBB (right bundle branch block with left anterior fascicular block), and Recurrent UTI.  PSH:    Past Surgical History:  Procedure Laterality Date  . ABDOMINAL HYSTERECTOMY    . chiari malformation    . hysterectomy (other)    . INGUINAL HERNIA REPAIR Right 12/20/2018   Procedure: HERNIA REPAIR INGUINAL INCARCERATED;  Surgeon: Carolan Shiver, MD;  Location: [facility] ORS;  Service: General;  Laterality: Right;  . leg vein    . parathyroid  surgery      Current Outpatient Medications  Medication Sig Dispense Refill  . amphetamine-dextroamphetamine (ADDERALL) 10 MG tablet Take 10 mg by mouth 2 (two) times a day.    Marland Kitchen aspirin EC 81 MG tablet Take 1 tablet (81 mg total) by mouth daily.    . bisoprolol-hydrochlorothiazide (ZIAC) 10-6.25 MG tablet Take 1 tablet by mouth daily.    . cyanocobalamin 100 MCG tablet Take 100 mcg by mouth daily.    Marland Kitchen levothyroxine (SYNTHROID) 25 MCG tablet Take 25 mcg by mouth every morning.    Marland Kitchen losartan-hydrochlorothiazide (HYZAAR) 100-12.5 MG tablet Take 1 tablet by mouth daily.     No current facility-administered medications for this visit.    Allergies:   Levofloxacin   Social History:  The patient  reports that she has never smoked. She has never used smokeless tobacco. She reports that she does not drink alcohol and does not use drugs.   Family History:   family history includes Alzheimer's disease in her mother; Breast cancer in her mother; Heart failure in her father; Stroke in her mother.    Review of Systems: Review of Systems  Constitutional: Negative.   Respiratory: Negative.   Cardiovascular: Negative.   Gastrointestinal: Negative.   Musculoskeletal: Negative.   Neurological: Negative.   Psychiatric/Behavioral: Positive for depression.  All other systems reviewed and are negative.   PHYSICAL EXAM: VS:  BP (!) 160/80 (BP Location: Left Arm, Patient Position: Sitting, Cuff Size: Normal)   Pulse 68   Ht 5\' 2"  (1.575  m)   Wt 133 lb 8 oz (60.6 kg)   SpO2 96%   BMI 24.42 kg/m  , BMI Body mass index is 24.42 kg/m. GEN: Well nourished, well developed, in no acute distress  HEENT: normal  Neck: no JVD, carotid bruits, or masses Cardiac: RRR; 1+ LSB,  No rubs, or gallops,no edema  Respiratory:  clear to auscultation bilaterally, normal work of breathing GI: soft, nontender, nondistended, + BS MS: no deformity or atrophy  Skin: warm and dry, no rash Neuro:  Strength and  sensation are intact Psych: euthymic mood, full affect   Recent Labs: No results found for requested labs within last 8760 hours.    Lipid Panel Lab Results  Component Value Date   CHOL 195 05/09/2009   HDL 58 05/09/2009   LDLCALC 120 (H) 05/09/2009   TRIG 87 05/09/2009      Wt Readings from Last 3 Encounters:  08/15/20 133 lb 8 oz (60.6 kg)  04/19/19 129 lb 8 oz (58.7 kg)  12/20/18 130 lb 1.1 oz (59 kg)       ASSESSMENT AND PLAN:  Essential hypertension - Plan: EKG 12-Lead Blood pressure running high, some confusion with her medications from primary care losartan HCT 50 mg daily  bisoprolol HCT Recommended that she add amlodipine 5 mg daily Monitor blood pressure closely at home and call us with some numbers May need 10 mg daily though we will need to be cautious for leg swelling  Abnormal EKG - Plan: EKG 12-Lead Right bundle branch block stable  Chronic fatigue - Plan: EKG 12-Lead Sedentary lifestyle, depression Managed by PMD May need SSRI She feels Adderall is not helping Suggested exercise program  Obesity Recommended dietary changes Recommended walking program  Leg swelling None on today's visit    Total encounter time more than 25 minutes  Greater than 50% was spent in counseling and coordination of care with the patient    No orders of the defined types were placed in this encounter.   Total encounter time more than 25 minutes  Greater than 50% was spent in counseling and coordination of care with the patient   Signed, Dossie Arbour, M.D., Ph.D. 08/15/2020  [facility] [place], [place] [phone]

## 2020-10-18 ENCOUNTER — Other Ambulatory Visit: Payer: Self-pay | Admitting: General Surgery

## 2020-10-18 DIAGNOSIS — K4091 Unilateral inguinal hernia, without obstruction or gangrene, recurrent: Secondary | ICD-10-CM

## 2020-10-31 NOTE — Progress Notes (Signed)
11/02/2020  2:45 PM   Lorraine Ferguson March 21, 1941 [id number]  Referring provider: Idelle Crouch, MD [facility] [place],  [place] [address] Chief Complaint  Patient presents with  . Urinary Incontinence    HPI: Lorraine Ferguson is a 80 y.o. female with a personal history of cystocele, vaginal atrophy, and recurrent UTIs, who presents today for evaluation and management of urinary incontinence and a PVR.  She was last seen 3 years ago at clinic when she was diagnosed with mixed stress and urge incontinence, midline cystocele, recurrent UTIs, and atrophic vaginitis. These were managed by a topical estrogen cream as well as samples of 25 mg of Myrbetriq for stress urinary incontinence.  Last seen in 2019 and her Myrbetriq has been changed to [place] by her PCP. She said that she wasn't taking anything initially, but that the medication is not really helping.  Today she reports that she is not any better. She said that he had an inguinal hernia, which they operated on and she was doing well after that. She said that she thinks there might be another hernia coming, and she saw Dr. Windell Moment for this. He said that he could operate on this again. She thinks this is going to schedule this operation in the near future.  She expresses that she feels like she has another inguinal hernia that has been getting slightly worse than previously. Overall this has been asymptomatic, but it has made it slightly harder for her to use the bathroom because of this. She is getting a CT scan on 11/08/2020.  She says that she is able to sleep through the night. She doesn't leak a when she laughs, coughs, or sneezes, but she does when she "hits the floor."  She also complains today of worsening vaginal prolapse.  She reports that she just does not have any muscle strength in her vaginal symptoms are increasing with prolapse of her bladder.  She has failed pessary in the  past.   PMH: Past Medical History:  Diagnosis Date  . Chiari malformation    hx with cervical syringomyelia, s/p surgery at [facility] 10-12 years ago  . Depression   . Fibrocystic breast disease   . HTN (hypertension)   . Hyperparathyroidism    s/p parathyroidectomy in 9/10  . Normal echocardiogram    EF 50-55%, mild LVH, trivial AI, no other valvular disease  . RBBB (right bundle branch block with left anterior fascicular block)   . Recurrent UTI    and urinary incontinence    Surgical History: Past Surgical History:  Procedure Laterality Date  . ABDOMINAL HYSTERECTOMY    . chiari malformation    . hysterectomy (other)    . INGUINAL HERNIA REPAIR Right 12/20/2018   Procedure: HERNIA REPAIR INGUINAL INCARCERATED;  Surgeon: Herbert Pun, MD;  Location: [facility] ORS;  Service: General;  Laterality: Right;  . leg vein    . parathyroid surgery      Home Medications:  Allergies as of 11/01/2020      Reactions   Levofloxacin    Caused arm pain.      Medication List       Accurate as of Nov 01, 2020 11:59 PM. If you have any questions, ask your nurse or doctor.        STOP taking these medications   solifenacin 10 MG tablet Commonly known as: VESICARE Stopped by: Hollice Espy, MD     TAKE these medications  amLODipine 5 MG tablet Commonly known as: NORVASC Take 1 tablet (5 mg total) by mouth daily.   amphetamine-dextroamphetamine 10 MG tablet Commonly known as: ADDERALL Take 10 mg by mouth 2 (two) times a day.   aspirin EC 81 MG tablet Take 1 tablet (81 mg total) by mouth daily.   bisoprolol-hydrochlorothiazide 10-6.25 MG tablet Commonly known as: ZIAC Take 1 tablet by mouth daily.   cyanocobalamin 100 MCG tablet Take 100 mcg by mouth daily.   Gemtesa 75 MG Tabs Generic drug: Vibegron Take 75 mg by mouth daily. Started by: Hollice Espy, MD   levothyroxine 25 MCG tablet Commonly known as: SYNTHROID Take 25 mcg by mouth every morning.    losartan-hydrochlorothiazide 100-12.5 MG tablet Commonly known as: HYZAAR Take 1 tablet by mouth daily.       Allergies: Allergies  Allergen Reactions  . Levofloxacin     Caused arm pain.    Family History: Family History  Problem Relation Age of Onset  . Alzheimer's disease Mother   . Stroke Mother   . Breast cancer Mother   . Heart failure Father   . Ovarian cancer Neg Hx   . Colon cancer Neg Hx   . Diabetes Neg Hx     Social History:   reports that she has never smoked. She has never used smokeless tobacco. She reports that she does not drink alcohol and does not use drugs.  ROS: Pertinent ROS in HPI.  Physical Exam: BP (!) 145/81   Pulse 61   Ht 4' 9"  (1.448 m)   Wt 135 lb (61.2 kg)   BMI 29.21 kg/m   Constitutional:  Alert and oriented, No acute distress. HEENT: [place] AT, moist mucus membranes.  Trachea midline, no masses. Cardiovascular: No clubbing, cyanosis, or edema. Respiratory: Normal respiratory effort, no increased work of breathing. GI: Palpable reducible right incisional versus abdominal wall hernia Skin: No rashes, bruises or suspicious lesions. Neurologic: Grossly intact, no focal deficits, moving all 4 extremities. Psychiatric: Normal mood and affect.  Laboratory Data:  Lab Results  Component Value Date   CREATININE 0.68 12/21/2018    CUrinalysis with microscopic from 09/26/2020: Ref Range & Units 1 mo ago   Color Yellow, Violet, Light Violet, Dark Violet Yellow   Clarity Clear Clear   Specific Gravity 1.000 - 1.030 1.015   pH, Urine 5.0 - 8.0 7.0   Protein, Urinalysis Negative, Trace mg/dL Negative   Glucose, Urinalysis Negative mg/dL Negative   Ketones, Urinalysis Negative mg/dL Negative   Blood, Urinalysis Negative TraceAbnormal   Nitrite, Urinalysis Negative Negative   Leukocyte Esterase, Urinalysis Negative Negative   White Blood Cells, Urinalysis None Seen, 0-3 /hpf 0-3   Red Blood Cells, Urinalysis None Seen, 0-3 /hpf 0-3    Bacteria, Urinalysis None Seen /hpf None Seen   Squamous Epithelial Cells, Urinalysis Rare, Few, None Seen /hpf None Seen    CMP from 09/26/2020: Ref Range & Units 1 mo ago   Glucose 70 - 110 mg/dL 94   Sodium 136 - 145 mmol/L 140   Potassium 3.6 - 5.1 mmol/L 4.3   Chloride 97 - 109 mmol/L 102   Carbon Dioxide (CO2) 22.0 - 32.0 mmol/L 32.2High   Urea Nitrogen (BUN) 7 - 25 mg/dL 15   Creatinine 0.6 - 1.1 mg/dL 0.8   Glomerular Filtration Rate (eGFR), MDRD Estimate >60 mL/min/1.73sq m 69   Calcium 8.7 - 10.3 mg/dL 9.6   AST  8 - 39 U/L 15   ALT  5 -  38 U/L 13   Alk Phos (alkaline Phosphatase) 34 - 104 U/L 81   Albumin 3.5 - 4.8 g/dL 4.1   Bilirubin, Total 0.3 - 1.2 mg/dL 0.7   Protein, Total 6.1 - 7.9 g/dL 6.8   A/G Ratio 1.0 - 5.0 gm/dL 1.5     I have reviewed the labs.  Pertinent Imaging: Scheduled for CT pelvis wo contrast on 11/08/2020.  No results found for this or any previous visit.  Assessment & Plan:    1. Urinary urgency, urge incontinence Previously failed Myrbetriq and now Vesicare without any improvement in her urge and urge incontinence  Stop Vesicare and start Gemtesa 25 mg for 1 month   2. Cystocele Significant bladder prolapse as previously noted on exam  Failed pessary  She questions today whether or not it makes sense to have her bladder repaired at the same time as her hernia repair-explained that I will discuss this with Dr. Windell Moment as well as one of my partners, Dr. Matilde Sprang  and get back to her.  If the approach planned is laparoscopic/robotic, it may make sense to consider this.  Follow Up:  Follow up with patient after further collaboration with collegues    I, Ardyth Gal, am acting as a scribe for Dr. Hollice Espy.   I have reviewed the above documentation for accuracy and completeness, and I agree with the above.   Hollice Espy, MD     [facility] [address] [place], [place] [address] [phone]

## 2020-11-01 ENCOUNTER — Ambulatory Visit (INDEPENDENT_AMBULATORY_CARE_PROVIDER_SITE_OTHER): Payer: Medicare Other | Admitting: Urology

## 2020-11-01 ENCOUNTER — Other Ambulatory Visit: Payer: Self-pay

## 2020-11-01 ENCOUNTER — Encounter: Payer: Self-pay | Admitting: Urology

## 2020-11-01 VITALS — BP 145/81 | HR 61 | Ht <= 58 in | Wt 135.0 lb

## 2020-11-01 DIAGNOSIS — N8111 Cystocele, midline: Secondary | ICD-10-CM

## 2020-11-01 DIAGNOSIS — R32 Unspecified urinary incontinence: Secondary | ICD-10-CM

## 2020-11-01 LAB — MICROSCOPIC EXAMINATION

## 2020-11-01 LAB — URINALYSIS, COMPLETE
Bilirubin, UA: NEGATIVE
Glucose, UA: NEGATIVE
Ketones, UA: NEGATIVE
Leukocytes,UA: NEGATIVE
Nitrite, UA: NEGATIVE
Protein,UA: NEGATIVE
Specific Gravity, UA: 1.02 (ref 1.005–1.030)
Urobilinogen, Ur: 1 mg/dL (ref 0.2–1.0)
pH, UA: 6.5 (ref 5.0–7.5)

## 2020-11-01 MED ORDER — GEMTESA 75 MG PO TABS: 75.0000 mg | ORAL_TABLET | Freq: Every day | ORAL | 0 refills | Status: DC

## 2020-11-08 ENCOUNTER — Other Ambulatory Visit: Payer: Self-pay

## 2020-11-08 ENCOUNTER — Ambulatory Visit
Admission: RE | Admit: 2020-11-08 | Discharge: 2020-11-08 | Disposition: A | Discharge status: Home / Self Care | Payer: Medicare Other | Source: Ambulatory Visit | Attending: General Surgery | Admitting: General Surgery

## 2020-11-08 DIAGNOSIS — K4091 Unilateral inguinal hernia, without obstruction or gangrene, recurrent: Secondary | ICD-10-CM | POA: Insufficient documentation

## 2020-11-19 IMAGING — US US EXTREM LOW VENOUS*R*
1 series · 14 of 24 positions shown · non-contrast
Comparison: None.

CLINICAL DATA: Swelling x2 weeks, medial thigh redness. Varicose
vein treatment 15 years ago.

EXAM:
RIGHT LOWER EXTREMITY VENOUS DOPPLER ULTRASOUND
TECHNIQUE: Gray-scale sonography with compression, as well as color and duplex
ultrasound, were performed to evaluate the deep venous system(s)
from the level of the common femoral vein through the popliteal and
proximal calf veins.

[Series 1: us extrem low venous*right* · 0.08mm/px · 37 acquisitions, 14 frames shown]
[im 1/37]
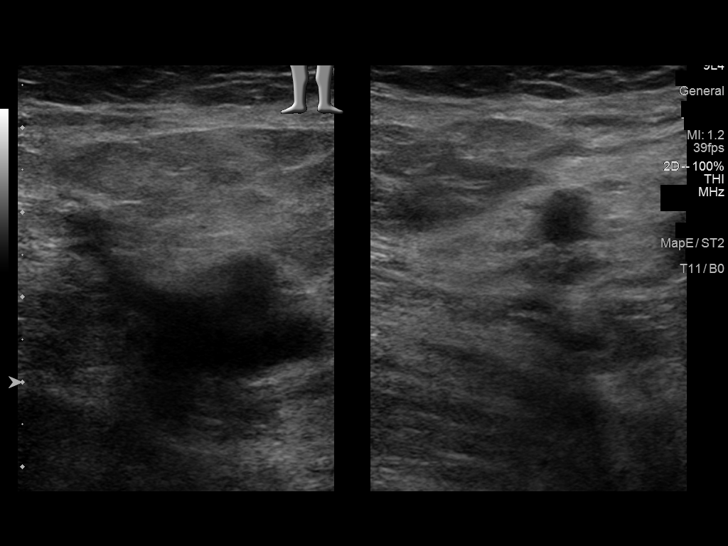
[im 4/37]
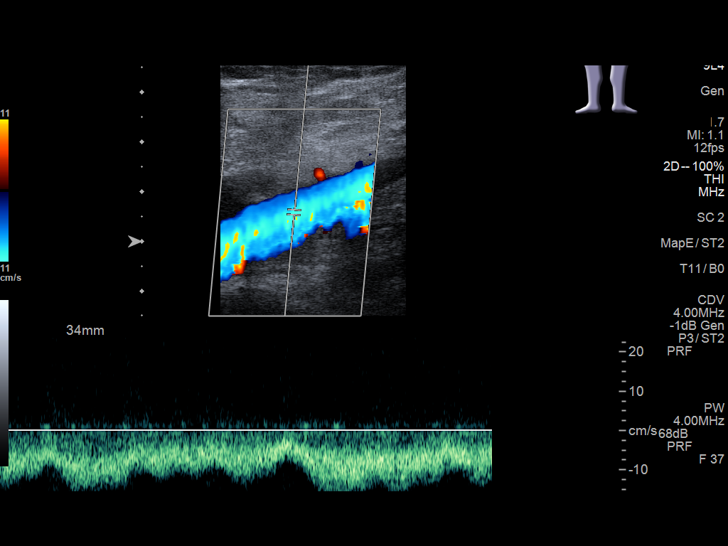
[im 7/37]
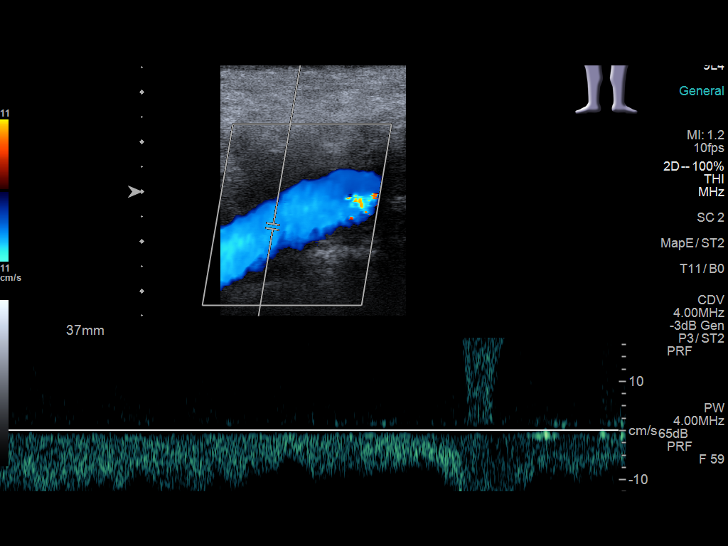
[im 10/37]
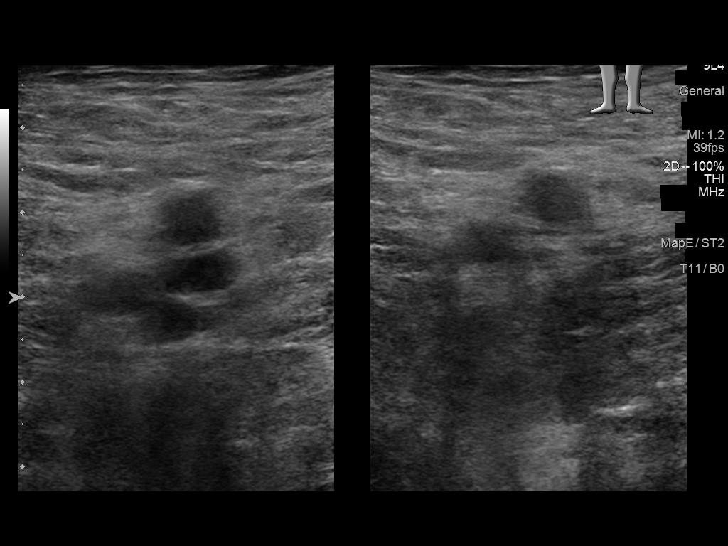
[im 11/37]
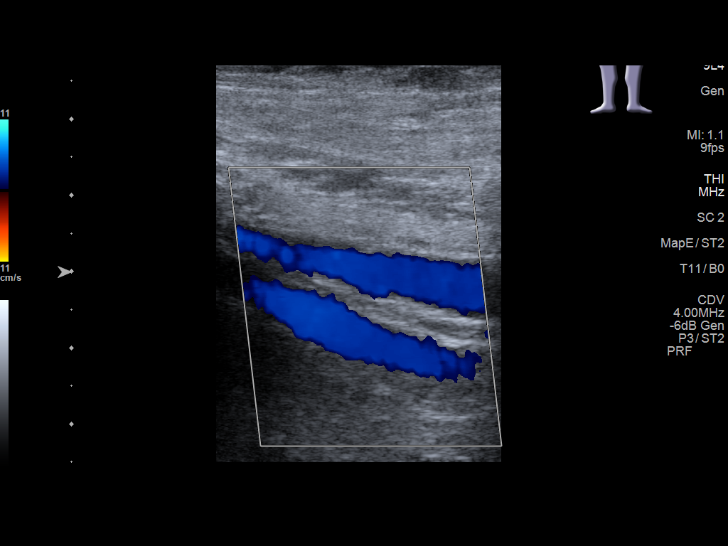
[im 15/37]
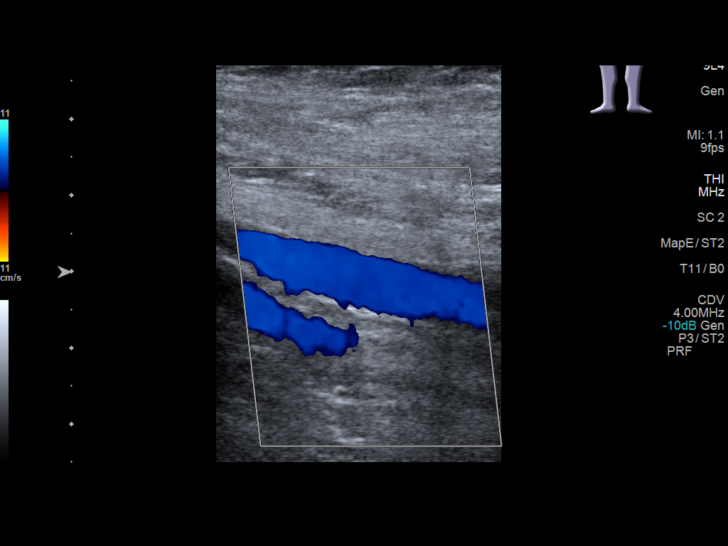
[im 18/37]
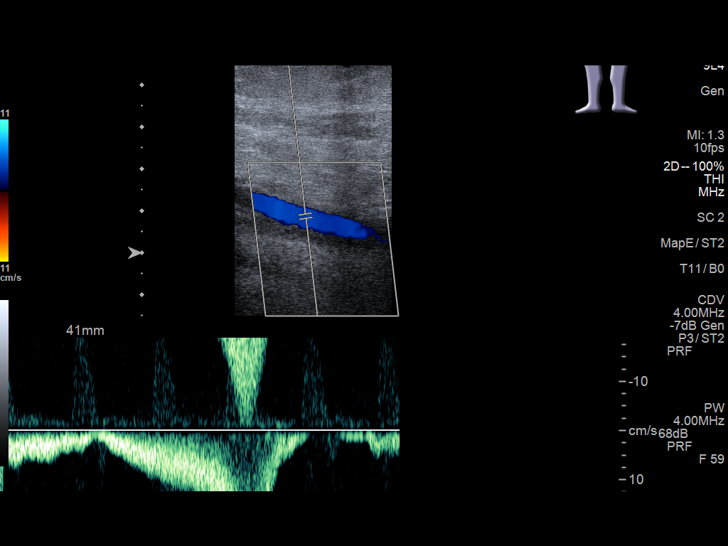
[im 21/37]
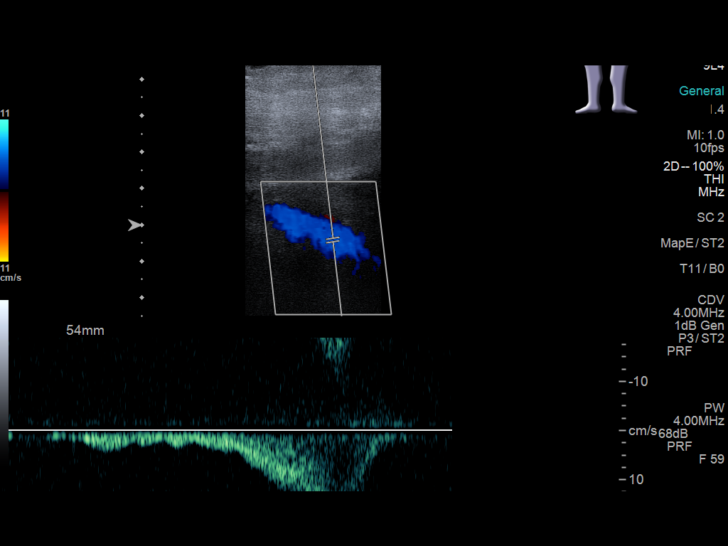
[im 24/37]
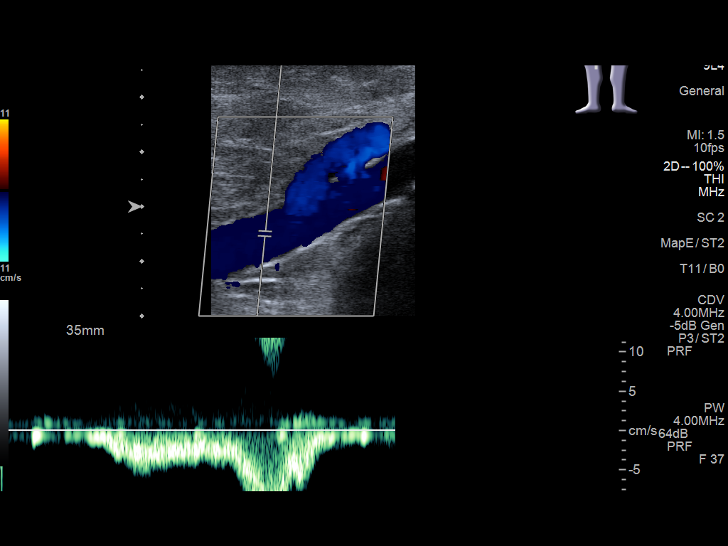
[im 27/37]
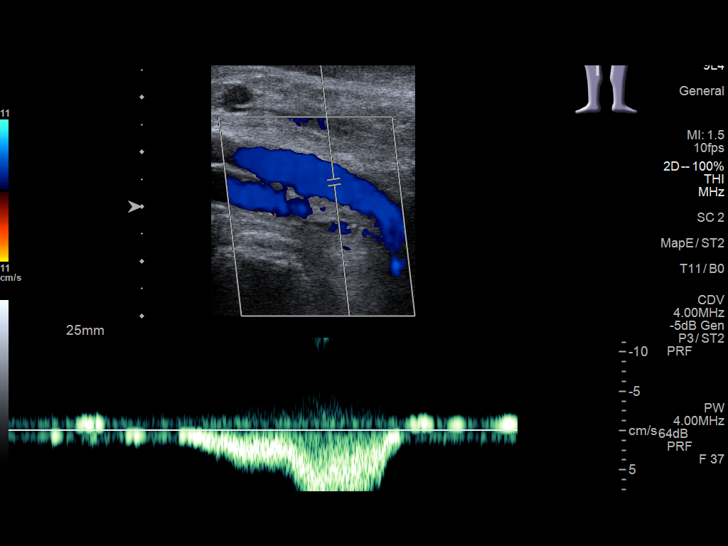
[im 30/37]
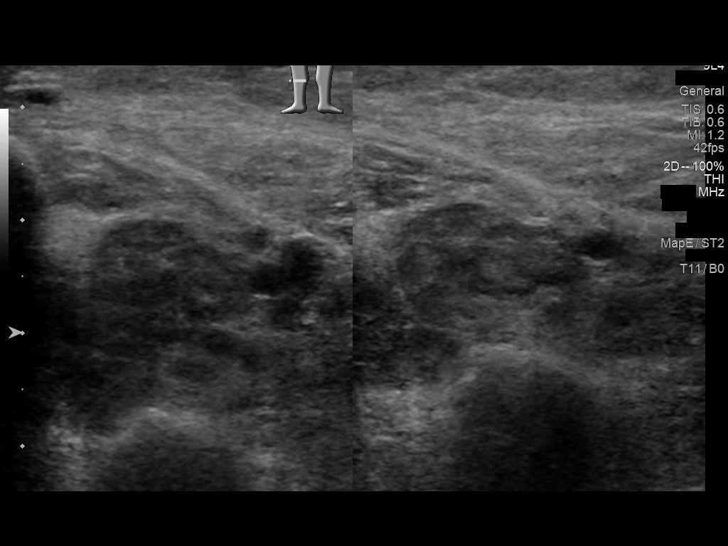
[im 32/37]
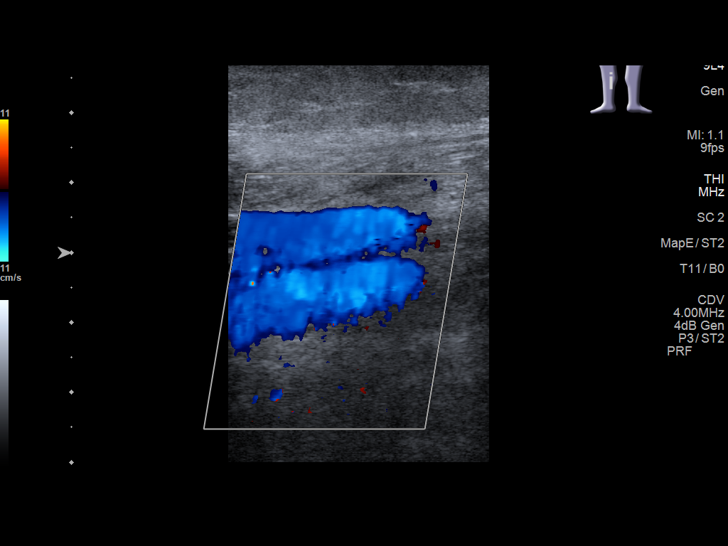
[im 33/37]
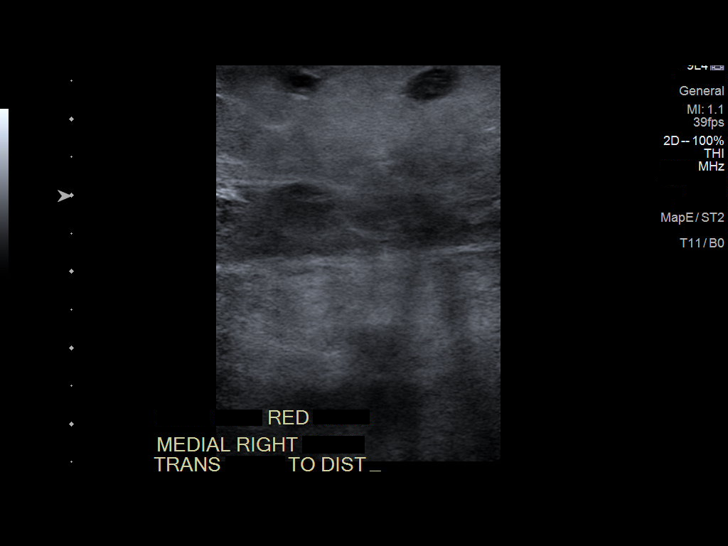
[im 37/37]
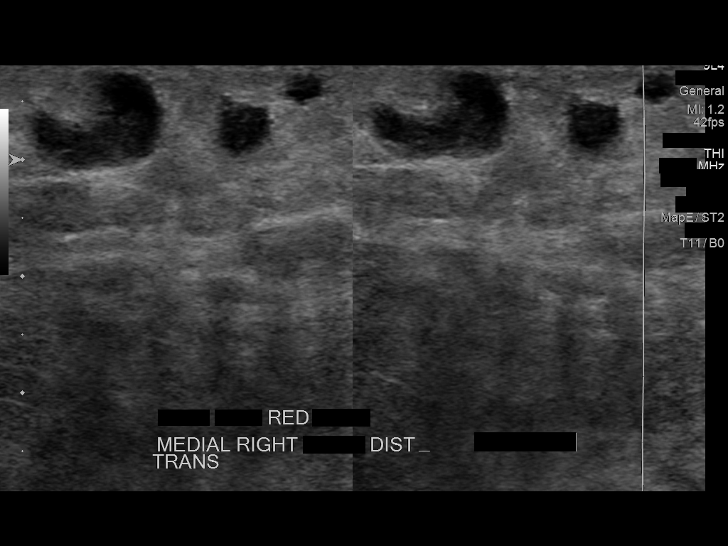

[14 of 24 positions shown; findings below may reference images not displayed]

FINDINGS: VENOUS

Normal compressibility of the common femoral, superficial femoral,
and popliteal veins, as well as the visualized calf veins.
Visualized portions of profunda femoral vein unremarkable. No
filling defects to suggest DVT on grayscale or color Doppler
imaging. Doppler waveforms show normal direction of venous flow,
normal respiratory phasicity and response to augmentation.

Limited views of the contralateral common femoral vein are
unremarkable.

OTHER

Thrombosed varicose veins in the medial thigh, corresponding to
palpable regions of concern.

Limitations: none
IMPRESSION: 1. Negative for right lower extremity DVT.
2. Medial right thigh superficial thrombophlebitis.

## 2020-11-23 ENCOUNTER — Ambulatory Visit (INDEPENDENT_AMBULATORY_CARE_PROVIDER_SITE_OTHER): Payer: Medicare Other | Admitting: Urology

## 2020-11-23 ENCOUNTER — Other Ambulatory Visit: Payer: Self-pay

## 2020-11-23 VITALS — BP 163/82 | HR 58 | Ht <= 58 in | Wt 135.0 lb

## 2020-11-23 DIAGNOSIS — N952 Postmenopausal atrophic vaginitis: Secondary | ICD-10-CM

## 2020-11-23 DIAGNOSIS — N8110 Cystocele, unspecified: Secondary | ICD-10-CM

## 2020-11-23 DIAGNOSIS — R32 Unspecified urinary incontinence: Secondary | ICD-10-CM | POA: Diagnosis not present

## 2020-11-23 DIAGNOSIS — N816 Rectocele: Secondary | ICD-10-CM

## 2020-11-23 MED ORDER — GEMTESA 75 MG PO TABS: 75.0000 mg | ORAL_TABLET | Freq: Every day | ORAL | 0 refills | Status: DC

## 2020-11-23 NOTE — Patient Instructions (Signed)
Please Apply small pea size amount of  estrogen cream once daily for the first two weeks. Then use the cream 3 x a week.

## 2020-11-23 NOTE — H&P (View-Only) (Signed)
11/23/2020 8:54 AM   Lorraine Ferguson Nov 11, 1940 [id number]  Referring provider: Marguarite Arbour, MD [address] [facility] [place],  [place] [address]  Chief Complaint  Patient presents with  . Urinary Incontinence    HPI: 80 year old female with a personal history of significant pelvic organ prolapse and urge incontinence who presents today to discuss management of her prolapse.  She was last seen few weeks ago in clinic at which time she mentioned she has a recurrent inguinal hernia.  She is now booked to undergo right inguinal hernia repair from a robotic approach.  In discussion with Dr. Hazle Quant, concomitant sacrocolpopexy at the time of hernia repair could be considered.  Please see previous notes for details.  She is status post abdominal hysterectomy.  She reports today that she does have increasing vaginal symptoms pressure and difficulties straining for bowel movements related to her pelvic organ prolapse.  She is not been using topical estrogen cream.  Since last visit, she was started on a new OAB medication, Gemtesa.  She reports that she is tolerated the medication very well and it is helped relieve a significant amount of her urge incontinence.  She is using fewer pads/diapers.  She is pleased with this medication.  She is concerned about the cost.  She has 3 more weeks of samples remaining.   PMH: Past Medical History:  Diagnosis Date  . Chiari malformation    hx with cervical syringomyelia, s/p surgery at [facility] 10-12 years ago  . Depression   . Fibrocystic breast disease   . HTN (hypertension)   . Hyperparathyroidism    s/p parathyroidectomy in 9/10  . Normal echocardiogram    EF 50-55%, mild LVH, trivial AI, no other valvular disease  . RBBB (right bundle branch block with left anterior fascicular block)   . Recurrent UTI    and urinary incontinence    Surgical History: Past Surgical History:  Procedure Laterality Date  .  ABDOMINAL HYSTERECTOMY    . chiari malformation    . hysterectomy (other)    . INGUINAL HERNIA REPAIR Right 12/20/2018   Procedure: HERNIA REPAIR INGUINAL INCARCERATED;  Surgeon: Carolan Shiver, MD;  Location: [facility] ORS;  Service: General;  Laterality: Right;  . leg vein    . parathyroid surgery      Home Medications:  Allergies as of 11/23/2020      Reactions   Levofloxacin    Caused arm pain.      Medication List       Accurate as of Nov 23, 2020  8:54 AM. If you have any questions, ask your nurse or doctor.        amLODipine 5 MG tablet Commonly known as: NORVASC Take 1 tablet (5 mg total) by mouth daily.   amphetamine-dextroamphetamine 10 MG tablet Commonly known as: ADDERALL Take 10 mg by mouth 2 (two) times a day.   aspirin EC 81 MG tablet Take 1 tablet (81 mg total) by mouth daily.   bisoprolol-hydrochlorothiazide 10-6.25 MG tablet Commonly known as: ZIAC Take 1 tablet by mouth daily.   cyanocobalamin 100 MCG tablet Take 100 mcg by mouth daily.   Gemtesa 75 MG Tabs Generic drug: Vibegron Take 75 mg by mouth daily.   levothyroxine 25 MCG tablet Commonly known as: SYNTHROID Take 25 mcg by mouth every morning.   losartan-hydrochlorothiazide 100-12.5 MG tablet Commonly known as: HYZAAR Take 1 tablet by mouth daily.       Allergies:  Allergies  Allergen  Reactions  . Levofloxacin     Caused arm pain.    Family History: Family History  Problem Relation Age of Onset  . Alzheimer's disease Mother   . Stroke Mother   . Breast cancer Mother   . Heart failure Father   . Ovarian cancer Neg Hx   . Colon cancer Neg Hx   . Diabetes Neg Hx     Social History:  reports that she has never smoked. She has never used smokeless tobacco. She reports that she does not drink alcohol and does not use drugs.   Physical Exam: BP (!) 163/82   Pulse (!) 58   Ht 4\' 9"  (1.448 m)   Wt 135 lb (61.2 kg)   BMI 29.21 kg/m   Constitutional:  Alert and  oriented, No acute distress. HEENT: [place] AT, moist mucus membranes.  Trachea midline, no masses. Cardiovascular: No clubbing, cyanosis, or edema. Respiratory: Normal respiratory effort, no increased work of breathing. Pelvic: Chaperoned by CMA Alise; normal external genitalia.  Significant stage II cystocele just beyond the vaginal introitus with Valsalva with significant apical descent.  She has a stage I rectocele.  Diffuse atrophic vaginitis. Skin: No rashes, bruises or suspicious lesions. Neurologic: Grossly intact, no focal deficits, moving all 4 extremities. Psychiatric: Normal mood and affect.  CT pelvis images were personally reviewed today, agree with radiologic interpretation. IMPRESSION: 1. Moderate-sized right inguinal hernia containing a portion of an anastomotic small bowel loops with fecalized material. No evidence of incarceration, bowel obstruction or ischemia. 2. Additional right groin hernia more laterally containing edematous fat. 3. Sigmoid diverticulosis.   Electronically Signed   By: M.D.   On: 11/09/2020 21:34  Assessment & Plan:    1. Pelvic organ prolapse quantification stage 2 cystocele Significant symptomatic pelvic organ prolapse, primarily of the anterior compartment as well as apical prolapse  Case was discussed with Dr. 01/09/2021 and Dr. Hazle Quant in [place] at [facility] urology, agree that vaginal vault surgery at the time of robotic inguinal hernia repair would maximize benefit to the patient by relief of 2 issues for which she is symptomatic with a similar approach and limit the amount of anesthetic.  We had a lengthy discussion today about the procedure itself.  We discussed that the goals of surgery are to improve primarily her vaginal symptoms and possibly her rectal symptoms but is unclear whether or not it would change her urinary symptoms related to urge incontinence and may also lead to new stress incontinence.  We do not have  urodynamics at this point in time however, suspect based on her anatomy age and history, she will likely develop stress incontinence with reduction of her cystocele.  She understands all of this and is still willing to proceed.  We certainly address her stress incontinence and continue to manage her urge incontinence down the road if that becomes an issue.  We discussed the risk of bleeding, infection, damage surrounding structures, vaginal mesh complications such as extrusion, adhesions, or erosion amongst others.  All questions were answered.  She is willing to proceed as planned.  2. Rectocele As above  3. Vaginal atrophy In anticipation of vaginal surgery, like to go ahead and get her started back on estrogen cream, to be used daily for 2 weeks and then 3 times a week  4. Urinary incontinence in female Improved stress incontinence on Gemtesa, has 3 more weeks, will likely continue this prescription postoperatively   [facility], MD  [facility] [phone]  [address] [place], [place] [address] [phone]

## 2020-11-23 NOTE — Progress Notes (Signed)
 11/23/2020 8:54 AM   Lorraine Ferguson 12/16/1940 [id number]  Referring provider: Sparks, Jeffrey D, MD [address] [facility] [place],  [place] [address]  Chief Complaint  Patient presents with  . Urinary Incontinence    HPI: 80-year-old female with a personal history of significant pelvic organ prolapse and urge incontinence who presents today to discuss management of her prolapse.  She was last seen few weeks ago in clinic at which time she mentioned she has a recurrent inguinal hernia.  She is now booked to undergo right inguinal hernia repair from a robotic approach.  In discussion with Dr. Cintron-Diaz, concomitant sacrocolpopexy at the time of hernia repair could be considered.  Please see previous notes for details.  She is status post abdominal hysterectomy.  She reports today that she does have increasing vaginal symptoms pressure and difficulties straining for bowel movements related to her pelvic organ prolapse.  She is not been using topical estrogen cream.  Since last visit, she was started on a new OAB medication, Gemtesa.  She reports that she is tolerated the medication very well and it is helped relieve a significant amount of her urge incontinence.  She is using fewer pads/diapers.  She is pleased with this medication.  She is concerned about the cost.  She has 3 more weeks of samples remaining.   PMH: Past Medical History:  Diagnosis Date  . Chiari malformation    hx with cervical syringomyelia, s/p surgery at [facility] 10-12 years ago  . Depression   . Fibrocystic breast disease   . HTN (hypertension)   . Hyperparathyroidism    s/p parathyroidectomy in 9/10  . Normal echocardiogram    EF 50-55%, mild LVH, trivial AI, no other valvular disease  . RBBB (right bundle branch block with left anterior fascicular block)   . Recurrent UTI    and urinary incontinence    Surgical History: Past Surgical History:  Procedure Laterality Date  .  ABDOMINAL HYSTERECTOMY    . chiari malformation    . hysterectomy (other)    . INGUINAL HERNIA REPAIR Right 12/20/2018   Procedure: HERNIA REPAIR INGUINAL INCARCERATED;  Surgeon: Cintron-Diaz, Edgardo, MD;  Location: [facility] ORS;  Service: General;  Laterality: Right;  . leg vein    . parathyroid surgery      Home Medications:  Allergies as of 11/23/2020      Reactions   Levofloxacin    Caused arm pain.      Medication List       Accurate as of Nov 23, 2020  8:54 AM. If you have any questions, ask your nurse or doctor.        amLODipine 5 MG tablet Commonly known as: NORVASC Take 1 tablet (5 mg total) by mouth daily.   amphetamine-dextroamphetamine 10 MG tablet Commonly known as: ADDERALL Take 10 mg by mouth 2 (two) times a day.   aspirin EC 81 MG tablet Take 1 tablet (81 mg total) by mouth daily.   bisoprolol-hydrochlorothiazide 10-6.25 MG tablet Commonly known as: ZIAC Take 1 tablet by mouth daily.   cyanocobalamin 100 MCG tablet Take 100 mcg by mouth daily.   Gemtesa 75 MG Tabs Generic drug: Vibegron Take 75 mg by mouth daily.   levothyroxine 25 MCG tablet Commonly known as: SYNTHROID Take 25 mcg by mouth every morning.   losartan-hydrochlorothiazide 100-12.5 MG tablet Commonly known as: HYZAAR Take 1 tablet by mouth daily.       Allergies:  Allergies  Allergen   Reactions  . Levofloxacin     Caused arm pain.    Family History: Family History  Problem Relation Age of Onset  . Alzheimer's disease Mother   . Stroke Mother   . Breast cancer Mother   . Heart failure Father   . Ovarian cancer Neg Hx   . Colon cancer Neg Hx   . Diabetes Neg Hx     Social History:  reports that she has never smoked. She has never used smokeless tobacco. She reports that she does not drink alcohol and does not use drugs.   Physical Exam: BP (!) 163/82   Pulse (!) 58   Ht 4\' 9"  (1.448 m)   Wt 135 lb (61.2 kg)   BMI 29.21 kg/m   Constitutional:  Alert and  oriented, No acute distress. HEENT: [place] AT, moist mucus membranes.  Trachea midline, no masses. Cardiovascular: No clubbing, cyanosis, or edema. Respiratory: Normal respiratory effort, no increased work of breathing. Pelvic: Chaperoned by CMA Alise; normal external genitalia.  Significant stage II cystocele just beyond the vaginal introitus with Valsalva with significant apical descent.  She has a stage I rectocele.  Diffuse atrophic vaginitis. Skin: No rashes, bruises or suspicious lesions. Neurologic: Grossly intact, no focal deficits, moving all 4 extremities. Psychiatric: Normal mood and affect.  CT pelvis images were personally reviewed today, agree with radiologic interpretation. IMPRESSION: 1. Moderate-sized right inguinal hernia containing a portion of an anastomotic small bowel loops with fecalized material. No evidence of incarceration, bowel obstruction or ischemia. 2. Additional right groin hernia more laterally containing edematous fat. 3. Sigmoid diverticulosis.   Electronically Signed   By: M.D.   On: 11/09/2020 21:34  Assessment & Plan:    1. Pelvic organ prolapse quantification stage 2 cystocele Significant symptomatic pelvic organ prolapse, primarily of the anterior compartment as well as apical prolapse  Case was discussed with Dr. 01/09/2021 and Dr. Hazle Quant in [place] at [facility] urology, agree that vaginal vault surgery at the time of robotic inguinal hernia repair would maximize benefit to the patient by relief of 2 issues for which she is symptomatic with a similar approach and limit the amount of anesthetic.  We had a lengthy discussion today about the procedure itself.  We discussed that the goals of surgery are to improve primarily her vaginal symptoms and possibly her rectal symptoms but is unclear whether or not it would change her urinary symptoms related to urge incontinence and may also lead to new stress incontinence.  We do not have  urodynamics at this point in time however, suspect based on her anatomy age and history, she will likely develop stress incontinence with reduction of her cystocele.  She understands all of this and is still willing to proceed.  We certainly address her stress incontinence and continue to manage her urge incontinence down the road if that becomes an issue.  We discussed the risk of bleeding, infection, damage surrounding structures, vaginal mesh complications such as extrusion, adhesions, or erosion amongst others.  All questions were answered.  She is willing to proceed as planned.  2. Rectocele As above  3. Vaginal atrophy In anticipation of vaginal surgery, like to go ahead and get her started back on estrogen cream, to be used daily for 2 weeks and then 3 times a week  4. Urinary incontinence in female Improved stress incontinence on Gemtesa, has 3 more weeks, will likely continue this prescription postoperatively   [facility], MD  [facility] [phone]  [address] [place], [place] [address] [phone]

## 2020-12-01 ENCOUNTER — Ambulatory Visit: Payer: Self-pay | Admitting: General Surgery

## 2020-12-04 ENCOUNTER — Other Ambulatory Visit
Admission: RE | Admit: 2020-12-04 | Discharge: 2020-12-04 | Disposition: A | Discharge status: Home / Self Care | Payer: Medicare Other | Source: Ambulatory Visit | Attending: General Surgery | Admitting: General Surgery

## 2020-12-04 ENCOUNTER — Other Ambulatory Visit: Payer: Self-pay

## 2020-12-04 HISTORY — DX: Hypothyroidism, unspecified: E03.9

## 2020-12-04 NOTE — Patient Instructions (Signed)
Your procedure is scheduled on: Friday December 08, 2020. Report to [facility] inside [facility] 2nd floor (Stop by admissions desk first before getting on elevator). To find out your arrival time please call [phone] between 1PM - 3PM on Thursday December 07, 2020.  Remember: Instructions that are not followed completely may result in serious medical risk,  up to and including death, or upon the discretion of your surgeon and anesthesiologist your  surgery may need to be rescheduled.     _X__ 1. Do not eat food after midnight the night before your procedure.                 No chewing gum or hard candies. You may drink clear liquids up to 2 hours                 before you are scheduled to arrive for your surgery- DO not drink clear                 liquids within 2 hours of the start of your surgery.                 Clear Liquids include:  water, apple juice without pulp, clear Gatorade, G2 or                  Gatorade Zero (avoid Red/Purple/Blue), Black Coffee or Tea (Do not add                 anything to coffee or tea).  __X__2.  On the morning of surgery brush your teeth with toothpaste and water, you                may rinse your mouth with mouthwash if you wish.  Do not swallow any toothpaste of mouthwash.     _X__ 3.  No Alcohol for 24 hours before or after surgery.   _X__ 4.  Do Not Smoke or use e-cigarettes For 24 Hours Prior to Your Surgery.                 Do not use any chewable tobacco products for at least 6 hours prior to                 Surgery.  _X__  5.  Do not use any recreational drugs (marijuana, cocaine, heroin, ecstasy, MDMA or other)                For at least one week prior to your surgery.  Combination of these drugs with anesthesia                May have life threatening results.  __X__ 6.  Notify your doctor if there is any change in your medical condition      (cold, fever, infections).     Do not wear jewelry, make-up, hairpins,  clips or nail polish. Do not wear lotions, powders, or perfumes. You may wear deodorant. Do not shave 48 hours prior to surgery. Men may shave face and neck. Do not bring valuables to the hospital.    [facility] is not responsible for any belongings or valuables.  Contacts, dentures or bridgework may not be worn into surgery. Leave your suitcase in the car. After surgery it may be brought to your room. For patients admitted to the hospital, discharge time is determined by your treatment team.   Patients discharged the day of surgery will not be allowed to drive  home.   Make arrangements for someone to be with you for the first 24 hours of your Same Day Discharge.   __X__ Take these medicines the morning of surgery with A SIP OF WATER:    1. amLODipine (NORVASC) 5 MG   2. levothyroxine (SYNTHROID) 25 MCG  3. Vibegron (GEMTESA) 75 MG   4.  5.  6.  ____ Fleet Enema (as directed)   ____ Use CHG Soap (or wipes) as directed  ____ Use Benzoyl Peroxide Gel as instructed  ____ Use inhalers on the day of surgery  ____ Stop metformin 2 days prior to surgery    ____ Take 1/2 of usual insulin dose the night before surgery. No insulin the morning          of surgery.   ____ Call your PCP, cardiologist, or Pulmonologist if taking Coumadin/Plavix/aspirin and ask when to stop before your surgery.   __X__ One Week prior to surgery- Stop Anti-inflammatories such as Ibuprofen, Aleve, Advil, Motrin, meloxicam (MOBIC), diclofenac, etodolac, ketorolac, Toradol, Daypro, piroxicam, Goody's or BC powders. OK TO USE TYLENOL IF NEEDED   __X__ Stop supplements until after surgery.    ____ Bring C-Pap to the hospital.    If you have any questions regarding your pre-procedure instructions,  Please call Pre-admit Testing at [phone].

## 2020-12-07 MED ORDER — CEFAZOLIN SODIUM-DEXTROSE 2-4 GM/100ML-% IV SOLN
2.0000 g | INTRAVENOUS | Status: AC
  Administered 2020-12-08 (×2): 2 g via INTRAVENOUS

## 2020-12-07 MED ORDER — FAMOTIDINE 20 MG PO TABS: 20.0000 mg | ORAL_TABLET | Freq: Once | ORAL | Status: AC

## 2020-12-07 MED ORDER — ORAL CARE MOUTH RINSE: 15.0000 mL | Freq: Once | OROMUCOSAL | Status: AC

## 2020-12-07 MED ORDER — LACTATED RINGERS IV SOLN: INTRAVENOUS | Status: DC

## 2020-12-07 MED ORDER — CHLORHEXIDINE GLUCONATE 0.12 % MT SOLN: 15.0000 mL | Freq: Once | OROMUCOSAL | Status: AC

## 2020-12-08 ENCOUNTER — Encounter
Admission: RE | Disposition: A | Discharge status: Home / Self Care | Payer: Self-pay | Source: Ambulatory Visit | Attending: Urology

## 2020-12-08 ENCOUNTER — Ambulatory Visit: Payer: Medicare Other | Admitting: Urgent Care

## 2020-12-08 ENCOUNTER — Observation Stay
Admission: RE | Admit: 2020-12-08 | Discharge: 2020-12-09 | Disposition: A | Discharge status: Home / Self Care | Payer: Medicare Other | Source: Ambulatory Visit | Attending: Urology | Admitting: Urology

## 2020-12-08 ENCOUNTER — Encounter: Payer: Self-pay | Admitting: General Surgery

## 2020-12-08 ENCOUNTER — Other Ambulatory Visit: Payer: Self-pay

## 2020-12-08 DIAGNOSIS — Z20822 Contact with and (suspected) exposure to covid-19: Secondary | ICD-10-CM | POA: Diagnosis not present

## 2020-12-08 DIAGNOSIS — I10 Essential (primary) hypertension: Secondary | ICD-10-CM | POA: Insufficient documentation

## 2020-12-08 DIAGNOSIS — N819 Female genital prolapse, unspecified: Secondary | ICD-10-CM | POA: Diagnosis present

## 2020-12-08 DIAGNOSIS — N816 Rectocele: Secondary | ICD-10-CM

## 2020-12-08 DIAGNOSIS — N8110 Cystocele, unspecified: Secondary | ICD-10-CM

## 2020-12-08 DIAGNOSIS — K660 Peritoneal adhesions (postprocedural) (postinfection): Secondary | ICD-10-CM | POA: Insufficient documentation

## 2020-12-08 DIAGNOSIS — K4031 Unilateral inguinal hernia, with obstruction, without gangrene, recurrent: Secondary | ICD-10-CM | POA: Diagnosis not present

## 2020-12-08 DIAGNOSIS — E039 Hypothyroidism, unspecified: Secondary | ICD-10-CM | POA: Diagnosis not present

## 2020-12-08 DIAGNOSIS — N994 Postprocedural pelvic peritoneal adhesions: Secondary | ICD-10-CM

## 2020-12-08 DIAGNOSIS — Z79899 Other long term (current) drug therapy: Secondary | ICD-10-CM | POA: Insufficient documentation

## 2020-12-08 DIAGNOSIS — Z7982 Long term (current) use of aspirin: Secondary | ICD-10-CM | POA: Diagnosis not present

## 2020-12-08 DIAGNOSIS — R32 Unspecified urinary incontinence: Secondary | ICD-10-CM

## 2020-12-08 HISTORY — PX: ROBOTIC ASSISTED LAPAROSCOPIC SACROCOLPOPEXY: SHX5388

## 2020-12-08 HISTORY — PX: XI ROBOTIC ASSISTED INGUINAL HERNIA REPAIR WITH MESH: SHX6706

## 2020-12-08 LAB — RESP PANEL BY RT-PCR (FLU A&B, COVID) ARPGX2
Influenza A by PCR: NEGATIVE
Influenza B by PCR: NEGATIVE
SARS Coronavirus 2 by RT PCR: NEGATIVE

## 2020-12-08 SURGERY — REPAIR, HERNIA, INGUINAL, ROBOT-ASSISTED, LAPAROSCOPIC, USING MESH
Anesthesia: General | Site: Groin | Laterality: Right

## 2020-12-08 MED ORDER — HYDROCODONE-ACETAMINOPHEN 7.5-325 MG PO TABS: 1.0000 | ORAL_TABLET | Freq: Once | ORAL | Status: DC | PRN

## 2020-12-08 MED ORDER — HEPARIN SODIUM (PORCINE) 5000 UNIT/ML IJ SOLN
5000.0000 [IU] | Freq: Three times a day (TID) | INTRAMUSCULAR | Status: DC
  Administered 2020-12-08 – 2020-12-09 (×2): 5000 [IU] via SUBCUTANEOUS
  Filled 2020-12-08 (×2): qty 1

## 2020-12-08 MED ORDER — OXYCODONE-ACETAMINOPHEN 5-325 MG PO TABS
1.0000 | ORAL_TABLET | ORAL | Status: DC | PRN
  Administered 2020-12-09: 1 via ORAL
  Filled 2020-12-08: qty 1

## 2020-12-08 MED ORDER — ROCURONIUM BROMIDE 100 MG/10ML IV SOLN
INTRAVENOUS | Status: DC | PRN
  Administered 2020-12-08: 15 mg via INTRAVENOUS
  Administered 2020-12-08: 50 mg via INTRAVENOUS
  Administered 2020-12-08: 10 mg via INTRAVENOUS
  Administered 2020-12-08: 15 mg via INTRAVENOUS

## 2020-12-08 MED ORDER — MEPERIDINE HCL 25 MG/ML IJ SOLN: 6.2500 mg | INTRAMUSCULAR | Status: DC | PRN

## 2020-12-08 MED ORDER — SUGAMMADEX SODIUM 200 MG/2ML IV SOLN
INTRAVENOUS | Status: DC | PRN
  Administered 2020-12-08: 100 mg via INTRAVENOUS
  Administered 2020-12-08 (×2): 50 mg via INTRAVENOUS

## 2020-12-08 MED ORDER — BELLADONNA ALKALOIDS-OPIUM 16.2-60 MG RE SUPP
1.0000 | Freq: Four times a day (QID) | RECTAL | Status: DC | PRN
Start: 2020-12-08 — End: 2020-12-09

## 2020-12-08 MED ORDER — ROCURONIUM BROMIDE 10 MG/ML (PF) SYRINGE
PREFILLED_SYRINGE | INTRAVENOUS | Status: AC
  Filled 2020-12-08: qty 10

## 2020-12-08 MED ORDER — SEVOFLURANE IN SOLN
RESPIRATORY_TRACT | Status: AC
  Filled 2020-12-08: qty 250

## 2020-12-08 MED ORDER — ONDANSETRON HCL 4 MG/2ML IJ SOLN: 4.0000 mg | INTRAMUSCULAR | Status: DC | PRN

## 2020-12-08 MED ORDER — SODIUM CHLORIDE 0.9 % IV SOLN: INTRAVENOUS | Status: DC

## 2020-12-08 MED ORDER — CEFAZOLIN SODIUM 1 G IJ SOLR
INTRAMUSCULAR | Status: AC
  Filled 2020-12-08: qty 20

## 2020-12-08 MED ORDER — LIDOCAINE HCL (CARDIAC) PF 100 MG/5ML IV SOSY
PREFILLED_SYRINGE | INTRAVENOUS | Status: DC | PRN
  Administered 2020-12-08: 100 mg via INTRAVENOUS

## 2020-12-08 MED ORDER — OXYBUTYNIN CHLORIDE ER 10 MG PO TB24
10.0000 mg | ORAL_TABLET | Freq: Every day | ORAL | Status: DC
  Administered 2020-12-08 – 2020-12-09 (×2): 10 mg via ORAL
  Filled 2020-12-08 (×2): qty 1

## 2020-12-08 MED ORDER — CHLORHEXIDINE GLUCONATE CLOTH 2 % EX PADS
6.0000 | MEDICATED_PAD | Freq: Every day | CUTANEOUS | Status: DC
  Administered 2020-12-09: 6 via TOPICAL

## 2020-12-08 MED ORDER — ACETAMINOPHEN 10 MG/ML IV SOLN
INTRAVENOUS | Status: DC | PRN
  Administered 2020-12-08: 1000 mg via INTRAVENOUS

## 2020-12-08 MED ORDER — BISOPROLOL-HYDROCHLOROTHIAZIDE 10-6.25 MG PO TABS
1.0000 | ORAL_TABLET | Freq: Every day | ORAL | Status: DC
  Administered 2020-12-08: 1 via ORAL
  Filled 2020-12-08 (×2): qty 1

## 2020-12-08 MED ORDER — MORPHINE SULFATE (PF) 2 MG/ML IV SOLN: 2.0000 mg | INTRAVENOUS | Status: DC | PRN

## 2020-12-08 MED ORDER — ONDANSETRON HCL 4 MG/2ML IJ SOLN
INTRAMUSCULAR | Status: AC
  Filled 2020-12-08: qty 2

## 2020-12-08 MED ORDER — FENTANYL CITRATE (PF) 100 MCG/2ML IJ SOLN
INTRAMUSCULAR | Status: DC | PRN
  Administered 2020-12-08: 100 ug via INTRAVENOUS
  Administered 2020-12-08 (×2): 50 ug via INTRAVENOUS

## 2020-12-08 MED ORDER — SUCCINYLCHOLINE CHLORIDE 200 MG/10ML IV SOSY
PREFILLED_SYRINGE | INTRAVENOUS | Status: AC
  Filled 2020-12-08: qty 10

## 2020-12-08 MED ORDER — LORATADINE 10 MG PO TABS
10.0000 mg | ORAL_TABLET | Freq: Every day | ORAL | Status: DC
  Administered 2020-12-09: 10 mg via ORAL
  Filled 2020-12-08: qty 1

## 2020-12-08 MED ORDER — FAMOTIDINE 20 MG PO TABS
ORAL_TABLET | ORAL | Status: AC
  Administered 2020-12-08: 20 mg via ORAL
  Filled 2020-12-08: qty 1

## 2020-12-08 MED ORDER — LOSARTAN POTASSIUM-HCTZ 100-12.5 MG PO TABS: 1.0000 | ORAL_TABLET | Freq: Every day | ORAL | Status: DC

## 2020-12-08 MED ORDER — ACETAMINOPHEN 10 MG/ML IV SOLN
INTRAVENOUS | Status: AC
  Filled 2020-12-08: qty 100

## 2020-12-08 MED ORDER — BUPIVACAINE-EPINEPHRINE (PF) 0.25% -1:200000 IJ SOLN
INTRAMUSCULAR | Status: AC
  Filled 2020-12-08: qty 30

## 2020-12-08 MED ORDER — BUPIVACAINE-EPINEPHRINE 0.5% -1:200000 IJ SOLN
INTRAMUSCULAR | Status: DC | PRN
  Administered 2020-12-08: 30 mL

## 2020-12-08 MED ORDER — CEFAZOLIN SODIUM-DEXTROSE 2-4 GM/100ML-% IV SOLN
INTRAVENOUS | Status: AC
  Filled 2020-12-08: qty 100

## 2020-12-08 MED ORDER — PROPOFOL 10 MG/ML IV BOLUS
INTRAVENOUS | Status: AC
  Filled 2020-12-08: qty 20

## 2020-12-08 MED ORDER — LOSARTAN POTASSIUM 50 MG PO TABS
100.0000 mg | ORAL_TABLET | Freq: Every day | ORAL | Status: DC
  Administered 2020-12-08: 100 mg via ORAL
  Filled 2020-12-08: qty 2

## 2020-12-08 MED ORDER — HYDROCODONE-ACETAMINOPHEN 5-325 MG PO TABS: 1.0000 | ORAL_TABLET | Freq: Four times a day (QID) | ORAL | 0 refills | Status: DC | PRN

## 2020-12-08 MED ORDER — ONDANSETRON HCL 4 MG/2ML IJ SOLN
INTRAMUSCULAR | Status: DC | PRN
  Administered 2020-12-08: 4 mg via INTRAVENOUS

## 2020-12-08 MED ORDER — DIPHENHYDRAMINE HCL 12.5 MG/5ML PO ELIX
12.5000 mg | ORAL_SOLUTION | Freq: Four times a day (QID) | ORAL | Status: DC | PRN
  Filled 2020-12-08: qty 5

## 2020-12-08 MED ORDER — LIDOCAINE HCL (PF) 2 % IJ SOLN
INTRAMUSCULAR | Status: AC
  Filled 2020-12-08: qty 5

## 2020-12-08 MED ORDER — SODIUM CHLORIDE 0.9 % IV SOLN
1.0000 g | Freq: Three times a day (TID) | INTRAVENOUS | Status: AC
  Administered 2020-12-08 – 2020-12-09 (×2): 1 g via INTRAVENOUS
  Filled 2020-12-08 (×2): qty 10

## 2020-12-08 MED ORDER — PHENYLEPHRINE HCL (PRESSORS) 10 MG/ML IV SOLN
INTRAVENOUS | Status: DC | PRN
  Administered 2020-12-08: 100 ug via INTRAVENOUS

## 2020-12-08 MED ORDER — ESTROGENS, CONJUGATED 0.625 MG/GM VA CREA
TOPICAL_CREAM | VAGINAL | Status: AC
  Filled 2020-12-08: qty 30

## 2020-12-08 MED ORDER — ACETAMINOPHEN 325 MG PO TABS: 650.0000 mg | ORAL_TABLET | ORAL | Status: DC | PRN

## 2020-12-08 MED ORDER — PROPOFOL 10 MG/ML IV BOLUS
INTRAVENOUS | Status: DC | PRN
  Administered 2020-12-08: 90 mg via INTRAVENOUS
  Administered 2020-12-08: 20 mg via INTRAVENOUS

## 2020-12-08 MED ORDER — PREMARIN 0.625 MG/GM VA CREA: 1.0000 | TOPICAL_CREAM | Freq: Every day | VAGINAL | 12 refills | Status: DC

## 2020-12-08 MED ORDER — DEXAMETHASONE SODIUM PHOSPHATE 10 MG/ML IJ SOLN
INTRAMUSCULAR | Status: DC | PRN
  Administered 2020-12-08: 10 mg via INTRAVENOUS

## 2020-12-08 MED ORDER — BUPIVACAINE LIPOSOME 1.3 % IJ SUSP
INTRAMUSCULAR | Status: AC
  Filled 2020-12-08: qty 20

## 2020-12-08 MED ORDER — VIBEGRON 75 MG PO TABS: 75.0000 mg | ORAL_TABLET | Freq: Every day | ORAL | Status: DC

## 2020-12-08 MED ORDER — FENTANYL CITRATE (PF) 100 MCG/2ML IJ SOLN: 25.0000 ug | INTRAMUSCULAR | Status: DC | PRN

## 2020-12-08 MED ORDER — DOCUSATE SODIUM 100 MG PO CAPS
100.0000 mg | ORAL_CAPSULE | Freq: Two times a day (BID) | ORAL | Status: DC
  Administered 2020-12-08 – 2020-12-09 (×2): 100 mg via ORAL
  Filled 2020-12-08 (×2): qty 1

## 2020-12-08 MED ORDER — ONDANSETRON HCL 4 MG/2ML IJ SOLN: 4.0000 mg | Freq: Once | INTRAMUSCULAR | Status: DC | PRN

## 2020-12-08 MED ORDER — AMPHETAMINE-DEXTROAMPHETAMINE 5 MG PO TABS
10.0000 mg | ORAL_TABLET | Freq: Two times a day (BID) | ORAL | Status: DC
  Administered 2020-12-09: 10 mg via ORAL
  Filled 2020-12-08: qty 2

## 2020-12-08 MED ORDER — AMLODIPINE BESYLATE 5 MG PO TABS
5.0000 mg | ORAL_TABLET | Freq: Every day | ORAL | Status: DC
  Administered 2020-12-08: 5 mg via ORAL
  Filled 2020-12-08: qty 1

## 2020-12-08 MED ORDER — FENTANYL CITRATE (PF) 250 MCG/5ML IJ SOLN
INTRAMUSCULAR | Status: AC
  Filled 2020-12-08: qty 5

## 2020-12-08 MED ORDER — CHLORHEXIDINE GLUCONATE 0.12 % MT SOLN
OROMUCOSAL | Status: AC
  Administered 2020-12-08: 15 mL via OROMUCOSAL
  Filled 2020-12-08: qty 15

## 2020-12-08 MED ORDER — DEXAMETHASONE SODIUM PHOSPHATE 10 MG/ML IJ SOLN
INTRAMUSCULAR | Status: AC
  Filled 2020-12-08: qty 1

## 2020-12-08 MED ORDER — LACTATED RINGERS IV SOLN: INTRAVENOUS | Status: DC

## 2020-12-08 MED ORDER — LEVOTHYROXINE SODIUM 50 MCG PO TABS
25.0000 ug | ORAL_TABLET | Freq: Every morning | ORAL | Status: DC
  Administered 2020-12-09: 25 ug via ORAL
  Filled 2020-12-08: qty 1

## 2020-12-08 MED ORDER — ESTROGENS, CONJUGATED 0.625 MG/GM VA CREA
TOPICAL_CREAM | VAGINAL | Status: DC | PRN
  Administered 2020-12-08: 1 via VAGINAL

## 2020-12-08 MED ORDER — BUPIVACAINE-EPINEPHRINE (PF) 0.5% -1:200000 IJ SOLN
INTRAMUSCULAR | Status: AC
  Filled 2020-12-08: qty 30

## 2020-12-08 MED ORDER — DIPHENHYDRAMINE HCL 50 MG/ML IJ SOLN: 12.5000 mg | Freq: Four times a day (QID) | INTRAMUSCULAR | Status: DC | PRN

## 2020-12-08 SURGICAL SUPPLY — 106 items
"PENCIL ELECTRO HAND CTR " (MISCELLANEOUS) ×2 IMPLANT
BAG INFUSER PRESSURE 100CC (MISCELLANEOUS) IMPLANT
BAG URINE DRAIN 2000ML AR STRL (UROLOGICAL SUPPLIES) ×2 IMPLANT
BLADE CLIPPER SURG (BLADE) ×4 IMPLANT
BLADE SURG SZ11 CARB STEEL (BLADE) ×4 IMPLANT
BRIEF STRETCH FOR OB PAD XXL (UNDERPADS AND DIAPERS) IMPLANT
BULB RESERV EVAC DRAIN JP 100C (MISCELLANEOUS) IMPLANT
CANISTER SUCT 1200ML W/VALVE (MISCELLANEOUS) ×2 IMPLANT
CATH FOLEY 2WAY  5CC 16FR (CATHETERS) ×2
CATH URTH 16FR FL 2W BLN LF (CATHETERS) ×2 IMPLANT
CHLORAPREP W/TINT 26 (MISCELLANEOUS) ×6 IMPLANT
CLIP VESOLOCK LG 6/CT PURPLE (CLIP) ×2 IMPLANT
CLIP VESOLOCK MED LG 6/CT (CLIP) IMPLANT
CLIP VESOLOCK XL 6/CT (CLIP) IMPLANT
COVER LIGHT HANDLE STERIS (MISCELLANEOUS) ×4 IMPLANT
COVER TIP SHEARS 8 DVNC (MISCELLANEOUS) ×4 IMPLANT
COVER TIP SHEARS 8MM DA VINCI (MISCELLANEOUS) ×4
COVER WAND RF STERILE (DRAPES) ×8 IMPLANT
DEFOGGER SCOPE WARMER CLEARIFY (MISCELLANEOUS) ×4 IMPLANT
DERMABOND ADVANCED (GAUZE/BANDAGES/DRESSINGS) ×2
DERMABOND ADVANCED .7 DNX12 (GAUZE/BANDAGES/DRESSINGS) ×4 IMPLANT
DRAIN CHANNEL JP 15F RND 16 (MISCELLANEOUS) IMPLANT
DRAIN CHANNEL JP 19F (MISCELLANEOUS) IMPLANT
DRAPE ARM DVNC X/XI (DISPOSABLE) ×14 IMPLANT
DRAPE COLUMN DVNC XI (DISPOSABLE) ×4 IMPLANT
DRAPE DA VINCI XI ARM (DISPOSABLE) ×14
DRAPE DA VINCI XI COLUMN (DISPOSABLE) ×4
DRAPE INCISE IOBAN 66X45 STRL (DRAPES) ×2 IMPLANT
DRAPE ROBOT W/ LEGGING 30X125 (DRAPES) ×4 IMPLANT
DRAPE SHEET LG 3/4 BI-LAMINATE (DRAPES) ×8 IMPLANT
DRAPE SURG IRRIG POUCH 19X23 (DRAPES) ×4 IMPLANT
DRSG TELFA 3X8 NADH (GAUZE/BANDAGES/DRESSINGS) IMPLANT
ELECT REM PT RETURN 9FT ADLT (ELECTROSURGICAL) ×8
ELECTRODE REM PT RTRN 9FT ADLT (ELECTROSURGICAL) ×4 IMPLANT
GAUZE 4X4 16PLY RFD (DISPOSABLE) IMPLANT
GAUZE PACK 2X3YD (PACKING) ×2 IMPLANT
GLOVE SURG ENC MOIS LTX SZ6.5 (GLOVE) ×18 IMPLANT
GLOVE SURG ENC TEXT LTX SZ7.5 (GLOVE) IMPLANT
GLOVE SURG UNDER POLY LF SZ6.5 (GLOVE) ×14 IMPLANT
GLOVE SURG UNDER POLY LF SZ7.5 (GLOVE) ×4 IMPLANT
GOWN STRL REUS W/ TWL LRG LVL3 (GOWN DISPOSABLE) ×8 IMPLANT
GOWN STRL REUS W/ TWL XL LVL3 (GOWN DISPOSABLE) ×4 IMPLANT
GOWN STRL REUS W/TWL LRG LVL3 (GOWN DISPOSABLE) ×12
GOWN STRL REUS W/TWL XL LVL3 (GOWN DISPOSABLE)
HEMOSTAT SURGICEL 2X14 (HEMOSTASIS) ×2 IMPLANT
HOLDER FOLEY CATH W/STRAP (MISCELLANEOUS) ×4 IMPLANT
IRRIGATION STRYKERFLOW (MISCELLANEOUS) ×2 IMPLANT
IRRIGATOR STRYKERFLOW (MISCELLANEOUS) ×4
IRRIGATOR SUCT 8 DISP DVNC XI (IRRIGATION / IRRIGATOR) IMPLANT
IRRIGATOR SUCTION 8MM XI DISP (IRRIGATION / IRRIGATOR) ×2
IV CATH ANGIO 12GX3 LT BLUE (NEEDLE) IMPLANT
IV NS 1000ML (IV SOLUTION) ×2
IV NS 1000ML BAXH (IV SOLUTION) IMPLANT
KIT PINK PAD W/HEAD ARE REST (MISCELLANEOUS) ×8
KIT PINK PAD W/HEAD ARM REST (MISCELLANEOUS) ×4 IMPLANT
KIT TURNOVER KIT A (KITS) ×2 IMPLANT
LABEL OR SOLS (LABEL) ×4 IMPLANT
MANIFOLD NEPTUNE II (INSTRUMENTS) ×8 IMPLANT
MANIPULATOR UTERINE 4.5 ZUMI (MISCELLANEOUS) IMPLANT
MARKER SKIN DUAL TIP RULER LAB (MISCELLANEOUS) ×2 IMPLANT
MESH 3DMAX 5X7 RT XLRG (Mesh General) ×2 IMPLANT
MESH Y UPSYLON VAGINAL (Mesh General) ×2 IMPLANT
NDL INSUFFLATION 14GA 120MM (NEEDLE) ×2 IMPLANT
NEEDLE HYPO 22GX1.5 SAFETY (NEEDLE) ×4 IMPLANT
NEEDLE INSUFFLATION 14GA 120MM (NEEDLE) ×4 IMPLANT
OBTURATOR OPTICAL STANDARD 8MM (TROCAR) ×2
OBTURATOR OPTICAL STND 8 DVNC (TROCAR) ×2
OBTURATOR OPTICALSTD 8 DVNC (TROCAR) ×2 IMPLANT
OCCLUDER COLPOPNEUMO (BALLOONS) IMPLANT
PACK BASIN MAJOR ARMC (MISCELLANEOUS) ×2 IMPLANT
PACK LAP CHOLECYSTECTOMY (MISCELLANEOUS) ×6 IMPLANT
PAD DRESSING TELFA 3X8 NADH (GAUZE/BANDAGES/DRESSINGS) ×2 IMPLANT
PAD OB MATERNITY 4.3X12.25 (PERSONAL CARE ITEMS) IMPLANT
PENCIL ELECTRO HAND CTR (MISCELLANEOUS) ×4 IMPLANT
POUCH SPECIMEN RETRIEVAL 10MM (ENDOMECHANICALS) IMPLANT
SCISSORS METZENBAUM CVD 33 (INSTRUMENTS) ×2 IMPLANT
SEAL CANN UNIV 5-8 DVNC XI (MISCELLANEOUS) ×14 IMPLANT
SEAL XI 5MM-8MM UNIVERSAL (MISCELLANEOUS) ×14
SET IRRIG Y TYPE TUR BLADDER L (SET/KITS/TRAYS/PACK) IMPLANT
SET TUBE SMOKE EVAC HIGH FLOW (TUBING) ×8 IMPLANT
SOL PREP PVP 2OZ (MISCELLANEOUS)
SOLUTION ELECTROLUBE (MISCELLANEOUS) ×8 IMPLANT
SOLUTION PREP PVP 2OZ (MISCELLANEOUS) IMPLANT
SPONGE LAP 4X18 RFD (DISPOSABLE) ×2 IMPLANT
SURGILUBE 2OZ TUBE FLIPTOP (MISCELLANEOUS) ×2 IMPLANT
SUT MNCRL 4-0 (SUTURE) ×2
SUT MNCRL 4-0 27XMFL (SUTURE) ×2
SUT MNCRL AB 4-0 PS2 18 (SUTURE) ×8 IMPLANT
SUT PROLENE 3 0 CT 1 (SUTURE) ×4 IMPLANT
SUT VIC AB 0 CT1 36 (SUTURE) ×2 IMPLANT
SUT VIC AB 2-0 SH 27 (SUTURE) ×14
SUT VIC AB 2-0 SH 27XBRD (SUTURE) ×12 IMPLANT
SUT VIC AB 3-0 SH 27 (SUTURE) ×2
SUT VIC AB 3-0 SH 27X BRD (SUTURE) ×2 IMPLANT
SUT VICRYL 0 UR6 27IN ABS (SUTURE) ×4 IMPLANT
SUT VLOC 90 S/L VL9 GS22 (SUTURE) ×2 IMPLANT
SUTURE MNCRL 4-0 27XMF (SUTURE) ×2 IMPLANT
SYR 50ML LL SCALE MARK (SYRINGE) IMPLANT
SYR BULB IRRIG 60ML STRL (SYRINGE) IMPLANT
TAPE TRANSPORE STRL 2 31045 (GAUZE/BANDAGES/DRESSINGS) IMPLANT
TOWEL OR 17X26 4PK STRL BLUE (TOWEL DISPOSABLE) ×8 IMPLANT
TRAY FOLEY MTR SLVR 16FR STAT (SET/KITS/TRAYS/PACK) ×2 IMPLANT
TROCAR ENDOPATH XCEL 12X100 BL (ENDOMECHANICALS) IMPLANT
TROCAR XCEL 12X100 BLDLESS (ENDOMECHANICALS) ×4 IMPLANT
TROCAR XCEL NON-BLD 5MMX100MML (ENDOMECHANICALS) IMPLANT
WATER STERILE IRR 1000ML POUR (IV SOLUTION) ×4 IMPLANT

## 2020-12-08 NOTE — H&P (Signed)
PATIENT PROFILE: Lorraine Ferguson is a 80 y.o. female who presents to the OR for treatment of right inguinal hernia and pelvic organ prolapse  PCP: Idelle Crouch, MD  HISTORY OF PRESENT ILLNESS: [name] reports started having pain on the right groin. The patient was previously evaluated there was a bulge in the right groin. Patient has a previous strangulated inguinal hernia repair with small bowel resection. This was done by primary repair due to the contamination from the strangulation. Patient denies any new episode of abdominal distention nausea or vomiting. Patient is able to reduce the hernia. Patient also having issues with constipation. She is trying to control her constipation with stool softener.  Patient also having problem with urinary incontinence with vaginal prolapse. She was evaluated by urology. It was addressed that the prolapse is getting worse and surgical management will be needed.  PROBLEM LIST: Problem List Date Reviewed: 10/04/2020  Noted  Vitamin D deficiency 03/03/2019  Recurrent UTI 02/21/2017  Abnormal EKG Unknown  Overview  W/ RIGHT BUNDLE BRANCH BLOCK AND LEFT ANTERIOR VESICULAR BLOCK.    Chronic fatigue 11/25/2013  HTN (hypertension) 11/25/2013  Hypothyroidism 11/25/2013  Urinary incontinence 11/25/2013  LBBB (left bundle branch block) 11/25/2013  Depression 11/25/2013    GENERAL REVIEW OF SYSTEMS:   General ROS: negative for - chills, fatigue, fever, weight gain or weight loss Allergy and Immunology ROS: negative for - hives  Hematological and Lymphatic ROS: negative for - bleeding problems or bruising, negative for palpable nodes Endocrine ROS: negative for - heat or cold intolerance, hair changes Respiratory ROS: negative for - cough, shortness of breath or wheezing Cardiovascular ROS: no chest pain or palpitations GI ROS: negative for nausea, vomiting, abdominal pain, diarrhea, positive for constipation GU: Positive for  incontinence Musculoskeletal ROS: negative for - joint swelling or muscle pain Neurological ROS: negative for - confusion, syncope Dermatological ROS: negative for pruritus and rash Psychiatric: negative for anxiety, depression, difficulty sleeping and memory loss  MEDICATIONS: Current Outpatient Medications  Medication Sig Dispense Refill   amLODIPine (NORVASC) 5 MG tablet Take 1 tablet (5 mg total) by mouth once daily 90 tablet 3   aspirin 81 MG EC tablet Take 81 mg by mouth once daily.   bisoproloL-hydrochlorothiazide (ZIAC) 10-6.25 mg tablet TAKE 1 TABLET BY MOUTH EVERY DAY 90 tablet 1   cyanocobalamin (VITAMIN B12) 1000 MCG tablet Take 1,000 mcg by mouth once daily.   dextroamphetamine-amphetamine (ADDERALL) 10 mg tablet Take 1 tablet (10 mg total) by mouth 2 (two) times daily for 30 days 60 tablet 0   levothyroxine (SYNTHROID) 25 MCG tablet TAKE 1 TABLET DAILY TAKE ON EMPTY STOMACH WITH GLASS OF WATER AT LEAST 30-60 MIN BEFORE BREAKFAST. 90 tablet 1   losartan-hydrochlorothiazide (HYZAAR) 100-12.5 mg tablet Take 1 tablet by mouth once daily 90 tablet 3   solifenacin (VESICARE) 10 MG tablet Take 1 tablet (10 mg total) by mouth once daily 30 tablet 11   No current facility-administered medications for this visit.   ALLERGIES: Levofloxacin  PAST MEDICAL HISTORY: Past Medical History:  Diagnosis Date   Abnormal EKG  W/ RIGHT BUNDLE BRANCH BLOCK AND LEFT ANTERIOR VESICULAR BLOCK.   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   Depression   Fatigue   Fibrocystic disease of breast   Hypoparathyroidism (CMS-HCC)   Hypothyroidism   Labyrinthitis   Menopause   Recurrent UTI   Urinary incontinence   Varicosities  LE   PAST SURGICAL HISTORY: Past Surgical History:  Procedure  Laterality Date   HYSTERECTOMY   INGUINAL HERNIA REPAIR Right 12/20/2018  Dr Lesli Albee   PARATHYROIDECTOMY    FAMILY HISTORY: Family History  Problem Relation Age of Onset    Stroke Mother   Dementia Mother   Heart failure Father   High blood pressure (Hypertension) Other    SOCIAL HISTORY: Social History   Socioeconomic History   Marital status: Married  Tobacco Use   Smoking status: Never Smoker   Smokeless tobacco: Never Used  Scientific laboratory technician Use: Never used  Substance and Sexual Activity   Alcohol use: No  Alcohol/week: 0.0 standard drinks   PHYSICAL EXAM: Vitals:  11/13/20 1349  BP: (!) 148/79  Pulse: 67   Body mass index is 28.63 kg/m. Weight: 62.1 kg (137 lb)   GENERAL: Alert, active, oriented x3  HEENT: Pupils equal reactive to light. Extraocular movements are intact. Sclera clear. Palpebral conjunctiva normal red color.Pharynx clear.  NECK: Supple with no palpable mass and no adenopathy.  LUNGS: Sound clear with no rales rhonchi or wheezes.  HEART: Regular rhythm S1 and S2 without murmur.  ABDOMEN: Soft and depressible, nontender with no palpable mass, no hepatomegaly. Reducible right inguinal hernia  EXTREMITIES: Well-developed well-nourished symmetrical with no dependent edema.  NEUROLOGICAL: Awake alert oriented, facial expression symmetrical, moving all extremities.  REVIEW OF DATA: I have reviewed the following data today: Appointment on 09/26/2020  Component Date Value   Glucose 09/26/2020 94   Sodium 09/26/2020 140   Potassium 09/26/2020 4.3   Chloride 09/26/2020 102   Carbon Dioxide (CO2) 09/26/2020 32.2 (!)   Urea Nitrogen (BUN) 09/26/2020 15   Creatinine 09/26/2020 0.8   Glomerular Filtration Ra* 09/26/2020 69   Calcium 09/26/2020 9.6   AST 09/26/2020 15   ALT 09/26/2020 13   Alk Phos (alkaline Phosp* 09/26/2020 81   Albumin 09/26/2020 4.1   Bilirubin, Total 09/26/2020 0.7   Protein, Total 09/26/2020 6.8   A/G Ratio 09/26/2020 1.5   WBC (White Blood Cell Co* 09/26/2020 6.8   RBC (Red Blood Cell Coun* 09/26/2020 4.55   Hemoglobin 09/26/2020 13.0   Hematocrit 09/26/2020 41.0   MCV (Mean Corpuscular  Vo* 09/26/2020 90.1   MCH (Mean Corpuscular He* 09/26/2020 28.6   MCHC (Mean Corpuscular H* 09/26/2020 31.7 (!)   Platelet Count 09/26/2020 264   RDW-CV (Red Cell Distrib* 09/26/2020 14.0   MPV (Mean Platelet Volum* 09/26/2020 9.7   Neutrophils 09/26/2020 4.09   Lymphocytes 09/26/2020 1.70   Monocytes 09/26/2020 0.70   Eosinophils 09/26/2020 0.26   Basophils 09/26/2020 0.05   Neutrophil % 09/26/2020 60.0   Lymphocyte % 09/26/2020 24.9   Monocyte % 09/26/2020 10.2   Eosinophil % 09/26/2020 3.8   Basophil% 09/26/2020 0.7   Immature Granulocyte % 09/26/2020 0.4   Immature Granulocyte Cou* 09/26/2020 0.03   Color 09/26/2020 Yellow   Clarity 09/26/2020 Clear   Specific Gravity 09/26/2020 1.015   pH, Urine 09/26/2020 7.0   Protein, Urinalysis 09/26/2020 Negative   Glucose, Urinalysis 09/26/2020 Negative   Ketones, Urinalysis 09/26/2020 Negative   Blood, Urinalysis 09/26/2020 Trace (!)   Nitrite, Urinalysis 09/26/2020 Negative   Leukocyte Esterase, Urin* 09/26/2020 Negative   White Blood Cells, Urina* 09/26/2020 0-3   Red Blood Cells, Urinaly* 09/26/2020 0-3   Bacteria, Urinalysis 09/26/2020 None Seen   Squamous Epithelial Cell* 09/26/2020 None Seen   Thyroid Stimulating Horm* 09/26/2020 2.862   Vitamin D, 25-Hydroxy - * 09/26/2020 44.2    ASSESSMENT: Ms. Kamps is a 80 y.o. female  presenting for surgical repair for recurrent right inguinal hernia and repair of pelvic organ prolapse (by Urology).   The patient presents with a symptomatic, reducible inguinal hernia. Patient was oriented about the diagnosis of inguinal hernia and its implication. The patient was oriented about the treatment alternatives (observation vs surgical repair). Due to patient symptoms, repair is recommended. Patient oriented about the surgical procedure, the use of mesh and its risk of complications such as: infection, bleeding, injury to vas deference, vasculature and testicle, injury to bowel or bladder, and  chronic pain.   We discussed about the recommendation of proceeding with repair. Since patient had primary repair with open approach I recommend to proceed with robotic assisted laparoscopic inguinal hernia repair. I discussed the case with Dr. Erlene Quan who also recommended to proceed with pexy of the bladder to improve her symptoms of incontinence and prolapse. We will try to coordinate this surgery together.  Unilateral recurrent inguinal hernia without obstruction or gangrene [K40.91]  PLAN: 1. Robotic assisted laparoscopic recurrent right inguinal hernia repair with mesh ([id number]) 2. CBC, CMP (done) 3. Avoid taking aspirin 5 days before procedure 4. Contact us if has any question or concern.  5. Will coordinate with Dr. Erlene Quan to do combined surgery to repair the bladder and the inguinal hernia

## 2020-12-08 NOTE — Op Note (Signed)
Preoperative diagnosis: Recurrent right inguinal hernia.   Postoperative diagnosis: Recurrent right inguinal hernia.  Procedure: Robotic assisted Laparoscopic Transabdominal preperitoneal laparoscopic (TAPP) repair of recurrent right inguinal hernia.  Anesthesia: GETA  Surgeon: Dr. Hazle Quant  Wound Classification: Clean  Indications:  Patient is a 80 y.o. female developed a symptomatic, recurrent right inguinal hernia. Repair was indicated.  Findings: 1. Direct Inguinal hernia identified   2. Round ligament identified and preserved 3. Extra large Bard 3D Max mesh used for repair 4. Adequate hemostasis.   Description of procedure: A time-out was completed verifying correct patient, procedure, site, positioning, and implant(s) and/or special equipment prior to beginning this procedure. Trocar placement and pneumoperitoneum already achieved by Urology.  The peritoneum was incised with scissors along a line 5 cm above the superior edge of the hernia defect, extending from the median umbilical ligament to the anterior superior iliac spine. The peritoneal flap was mobilized inferiorly using blunt and sharp dissection. The inferior epigastric vessels were exposed and the pubic symphysis was identified. Cooper's ligament was dissected to its junction with the iliac vein. The dissection was continued inferiorly to the iliopubic tract, with care taken to avoid injury to the femoral branch of the genitofemoral nerve and the lateral femoral cutaneous nerve. The round ligament was parietalized. The hernia was identified and reduced by gentle traction.  The direct hernia sac was noted mobilized from the round ligament and reduced into the peritoneal cavity.  An extra large piece of mesh was rolled longitudinally into a compact cylinder and passed through a trocar. The cylinder was placed along the inferior aspect of the working space and unrolled into place to completely cover the direct, indirect,  and femoral spaces. The mesh was secured into place superiorly to the anterior abdominal wall and inferiorly and medially to Cooper's ligament with absorbable sutures. Care was taken to avoid the inferolateral triangles containing the iliac vessels and genital nerves. The peritoneal flap was closed over the mesh and secured with suture in similar positions of safety. After ensuring adequate hemostasis, the trocars were removed and the pneumoperitoneum allowed to escape. 12 mm laparoscopic trocar closed with 0 vicryl. The trocar incisions were closed using monocryl and skin adhesive dressings applied.  The patient tolerated the procedure well and was taken to the postanesthesia care unit in stable condition.   Specimen: None  Complications: None  Estimated Blood Loss: 10 mL

## 2020-12-08 NOTE — Op Note (Signed)
Date of procedure: 12/08/20  Preoperative diagnosis: Pelvic organ prolapse    Postoperative diagnosis: same  Extensive bowel adhesions   Procedure: Robotic assisted laparoscopic sacrocolpopexy Laparoscopic intestinal lysis of adhesions - 17mn   Surgeon: AHollice Espy MD   1st assistant: BLouis Meckel MD  An assistant was required for this surgical procedure.  The duties of the assistant included but were not limited to suctioning, passing suture, camera manipulation, retraction. This procedure would not be able to be performed without an aEnvironmental consultant   Anesthesia: General   Complications: None   Intraoperative findings: [employer] Y-Mesh placed, cut to specific vaginal length   Intraoperatively it was brought to my attention that the mesh kit had been recalled.  Prior to placing the mesh we contacted sales rep from [employer] please see the nursing documentation for more details.  She informed [place] the recall was on the the culpable assist device.  At the time that this was brought to my attention we had already inserted the scope assist device into the patient's vagina.  The rep assured me in the tenet there was no recalls on the mesh.  We opted to proceed with the placement of the mesh at this time.     EBL: 150cc   Specimens: None   Indication: .81y.o. female patient with symptomatic pelvic organ prolapse.  After reviewing the management options for treatment, he elected to proceed with the above surgical procedure(s). We have discussed the potential benefits and risks of the procedure, side effects of the proposed treatment, the likelihood of the patient achieving the goals of the procedure, and any potential problems that might occur during the procedure or recuperation. Informed consent has been obtained.   Description of procedure:   A Foley catheter was then placed and placed to gravity drainage. I then made a periumbilical incision carrying  the dissection down to the patient's fascia with electrocautery.  A Veress needle was used to carefully into the peritoneal cavity and the abdomen was insufflated with low opening pressures.  A 84mport was then placed into the abdomen using a visible obturator.  The remaining ports placed under direct vision..  2 ports were placed lateral to the umbilicus on the right proximally 10 cm apart.  The most lateral port was approximately 3 cm above the anterior iliac spine.  2 additional ports were placed in the patient's right side in comparable positions to the most lateral port on the right was a 12 mm port.the assistant port was preclosed using a 0 Vicryl suture using a Carter-Thomason closure device to be tied down at the end of the case.  The robot was then docked at an angle from the leg obliquely along the side of the left leg.  I spent 20 minutes taking down adhesions on the right pelvic sidewall from her previous abdominal surgery as well as reducing to inguinal hernias on the right, direct inguinal hernia x 2.   We then began our surgery by cleaning up some of the pelvic adhesions to the small bowel and colon.  Spent additional 25 minutes taking adhesions down of the colon and small bowel in the pelvis so that I could get the bowel out of the pelvis in order to facilitate the surgery.   Once this was completed I started dissecting at the sacral promontory located 3 cm medial to the location where the ureter crosses over the iliac vessels at the pelvic brim. The posterior peritoneum was incised and  the sacral prominence cleared off an area taking care to avoid the middle sacral vessels and the iliac branches.  I then created a posterior peritoneal tunnel starting at the sacral promontory and tunneling down the right pelvic sidewall down into the pelvis breaking back through the posterior peritoneum around the vesico-vaginal junction posteriorly.  I then continued the posterior dissection retracting down on  the rectum and finding the avascular plane between the posterior vaginal wall and the rectum.  I carried this dissection down as far as I could to along the area of the perineal body. I then turned my attention to the anterior plane between the anterior vaginal wall and the bladder.  I was able to obtain access to the avascular plane and with a combination of both monopolar cautery and blunt dissection was able to clean and nice down to the bladder neck.    Mesh was measured at approximately 7cm anteriorly and 7cm posteriorly and I cut this on the back table.  The mesh was then placed into the patient's abdomen through the assistant port and the anterior leaf was secured down onto the anterior vaginal wall with the apex at the bladder neck.  The posterior leaf was then secured down on the posterior vaginal wall.  These were sewn down with 2-0 Vicryl.  Between 6 and 8 were done on each side.  At this point I then went back to the previously dissected sacral promontory and posterior peritoneal tunnel and inserted a instrument through the tunnel and grasped the end of the mesh at the vaginal cuff and pull it up to the sacrum.  I then checked to ensure that the sacral mesh was not too tight by performing a vaginal exam.  I then secured the sacral leg of the mesh using a 0 Prolene x 2.  Careful and adequate hemostasis was achieved.  I did pack the pelvis with some Surgicel around the vagina prior to closing the peritoneum.  I then reapproximated the posterior peritoneum with a 2-0 Vicryl in a running fashion around the sacral promontory.  The pelvic peritoneum was closed using similar running stitch.  The remainder of the procedure was performed by Dr. Windell Moment along with port site closure including the preclosed assistant port.  Please he has dictation for this procedure.   The patient was subsequently extubated and returned to the PACU in excellent condition.  Hollice Espy, MD

## 2020-12-08 NOTE — Transfer of Care (Signed)
Immediate Anesthesia Transfer of Care Note  Patient: Lorraine Ferguson  Procedure(s) Performed: XI ROBOTIC ASSISTED vs Open INGUINAL HERNIA REPAIR WITH MESH (Right: Groin) XI ROBOTIC ASSISTED LAPAROSCOPIC SACROCOLPOPEXY  Patient Location: PACU  Anesthesia Type:General  Level of Consciousness: sedated  Airway & Oxygen Therapy: Patient Spontanous Breathing and Patient connected to face mask oxygen  Post-op Assessment: Report given to RN and Post -op Vital signs reviewed and stable  Post vital signs: Reviewed and stable  Last Vitals:  Vitals Value Taken Time  BP 160/84 12/08/20 1401  Temp 36.1 C 12/08/20 1401  Pulse 79 12/08/20 1406  Resp 21 12/08/20 1406  SpO2 97 % 12/08/20 1406  Vitals shown include unvalidated device data.  Last Pain:  Vitals:   12/08/20 1401  TempSrc: Tympanic  PainSc: 0-No pain      Patients Stated Pain Goal: 0 (12/08/20 0612)  Complications: No notable events documented.

## 2020-12-08 NOTE — Addendum Note (Signed)
Addendum  created 12/08/20 1632 by Renee Ramus, CRNA   Attestation recorded in [place], Flowsheet accepted, Intraprocedure Attestations deleted, Company secretary

## 2020-12-08 NOTE — Discharge Instructions (Addendum)
Activity:  You are encouraged to ambulate frequently (about every hour during waking hours) to help prevent blood clots from forming in your legs or lungs.  However, you should not engage in any heavy lifting (> 5-10 lbs), strenuous activity, or straining.  Diet: You should advance your diet as instructed by your physician.  It will be normal to have some bloating, nausea, and abdominal discomfort intermittently.  Prescriptions:  You will be provided a prescription for pain medication to take as needed.  If your pain is not severe enough to require the prescription pain medication, you may take extra strength Tylenol instead which will have less side effects.  You should also take a prescribed stool softener to avoid straining with bowel movements as the prescription pain medication may constipate you.  Incisions: You may remove your dressing bandages 48 hours after surgery if not removed in the hospital.  You will either have some small staples or special tissue glue at each of the incision sites. Once the bandages are removed (if present), the incisions may stay open to air.  You may start showering (but not soaking or bathing in water) the 2nd day after surgery and the incisions simply need to be patted dry after the shower.  No additional care is needed.  You will be using Premarin cream per vagina for the next 6 weeks.  What to call us about: You should call the office if you develop fever > 101 or develop persistent vomiting, redness or draining around your incision, or any other concerning symptoms.    [facility] [address] [place], [place] [address] [phone]

## 2020-12-08 NOTE — Anesthesia Procedure Notes (Signed)
Procedure Name: Intubation Date/Time: 12/08/2020 8:02 AM Performed by: Renee Ramus, CRNA Pre-anesthesia Checklist: Patient identified, Emergency Drugs available, Suction available and Patient being monitored Patient Re-evaluated:Patient Re-evaluated prior to induction Oxygen Delivery Method: Circle system utilized Preoxygenation: Pre-oxygenation with 100% oxygen Induction Type: IV induction Ventilation: Mask ventilation without difficulty Laryngoscope Size: McGraph and 3 Grade View: Grade I Tube type: Oral Tube size: 7.0 mm Number of attempts: 1 Airway Equipment and Method: Stylet and Oral airway Placement Confirmation: ETT inserted through vocal cords under direct vision, positive ETCO2 and breath sounds checked- equal and bilateral Secured at: 21 cm Tube secured with: Tape Dental Injury: Teeth and Oropharynx as per pre-operative assessment

## 2020-12-08 NOTE — Anesthesia Preprocedure Evaluation (Addendum)
Anesthesia Evaluation  Patient identified by MRN, date of birth, ID band Patient awake    Reviewed: Allergy & Precautions, NPO status , Patient's Chart, lab work & pertinent test results  History of Anesthesia Complications Negative for: history of anesthetic complications  Airway Mallampati: II  TM Distance: <3 FB Neck ROM: Full    Dental  (+) Teeth Intact   Pulmonary neg pneumonia ,    Pulmonary exam normal breath sounds clear to auscultation (-) decreased breath sounds(-) wheezing  (-) rales    Cardiovascular hypertension (controlled), Pt. on medications (-) CAD, (-) Past MI, (-) Cardiac Stents, (-) CABG, (-) CHF, (-) Orthopnea, (-) PND and (-) DOE + dysrhythmias (RBBB, PVCs)  Rhythm:Regular Rate:Normal - Systolic murmurs Pt not active   Neuro/Psych    GI/Hepatic negative GI ROS, Neg liver ROS,   Endo/Other  Hypothyroidism   Renal/GU      Musculoskeletal negative musculoskeletal ROS (+)   Abdominal   Peds  Hematology   Anesthesia Other Findings   Reproductive/Obstetrics                            Anesthesia Physical Anesthesia Plan  ASA: 3  Anesthesia Plan: General   Post-op Pain Management:    Induction: Intravenous  PONV Risk Score and Plan: Ondansetron and Dexamethasone  Airway Management Planned: Oral ETT  Additional Equipment:   Intra-op Plan:   Post-operative Plan: Extubation in OR  Informed Consent: I have reviewed the patients History and Physical, chart, labs and discussed the procedure including the risks, benefits and alternatives for the proposed anesthesia with the patient or authorized representative who has indicated his/her understanding and acceptance.       Plan Discussed with: CRNA  Anesthesia Plan Comments: (Discussed b/r to include MI, CVA and death.  Pt wishes to proceed.)        Anesthesia Quick Evaluation

## 2020-12-08 NOTE — Interval H&P Note (Signed)
History and Physical Interval Note:  12/08/2020 7:21 AM  Lorraine Ferguson  has presented today for surgery, with the diagnosis of K40.91 Unilateral reccurent inguinal hernia w/o obstruction or gangrene, CYSTOCELE.  The various methods of treatment have been discussed with the patient and family. After consideration of risks, benefits and other options for treatment, the patient has consented to  Procedure(s): XI ROBOTIC ASSISTED vs Open INGUINAL HERNIA REPAIR WITH MESH (Right) XI ROBOTIC ASSISTED LAPAROSCOPIC SACROCOLPOPEXY (N/A) as a surgical intervention.  The patient's history has been reviewed, patient examined, no change in status, stable for surgery.  I have reviewed the patient's chart and labs.  Questions were answered to the patient's satisfaction.    RRR CTAB  Vanna Scotland

## 2020-12-08 NOTE — Anesthesia Postprocedure Evaluation (Signed)
Anesthesia Post Note  Patient: Lorraine Ferguson  Procedure(s) Performed: XI ROBOTIC ASSISTED vs Open INGUINAL HERNIA REPAIR WITH MESH (Right: Groin) XI ROBOTIC ASSISTED LAPAROSCOPIC SACROCOLPOPEXY  Patient location during evaluation: PACU Anesthesia Type: General Level of consciousness: awake and alert Pain management: pain level controlled Vital Signs Assessment: post-procedure vital signs reviewed and stable Respiratory status: spontaneous breathing, nonlabored ventilation, respiratory function stable and patient connected to nasal cannula oxygen Cardiovascular status: blood pressure returned to baseline and stable Postop Assessment: no apparent nausea or vomiting Anesthetic complications: no   No notable events documented.   Last Vitals:  Vitals:   12/08/20 1431 12/08/20 1445  BP: (!) 180/93 (!) 142/88  Pulse: 74 72  Resp: 16 17  Temp: (!) 35.9 C   SpO2: 100% 96%    Last Pain:  Vitals:   12/08/20 1431  TempSrc:   PainSc: Asleep                 Felicita Gage

## 2020-12-09 ENCOUNTER — Encounter: Payer: Self-pay | Admitting: General Surgery

## 2020-12-09 DIAGNOSIS — K4031 Unilateral inguinal hernia, with obstruction, without gangrene, recurrent: Secondary | ICD-10-CM | POA: Diagnosis not present

## 2020-12-09 LAB — CBC
HCT: 36.5 % (ref 36.0–46.0)
Hemoglobin: 12 g/dL (ref 12.0–15.0)
MCH: 29.4 pg (ref 26.0–34.0)
MCHC: 32.9 g/dL (ref 30.0–36.0)
MCV: 89.5 fL (ref 80.0–100.0)
Platelets: 275 10*3/uL (ref 150–400)
RBC: 4.08 MIL/uL (ref 3.87–5.11)
RDW: 13.2 % (ref 11.5–15.5)
WBC: 9.3 10*3/uL (ref 4.0–10.5)
nRBC: 0 % (ref 0.0–0.2)

## 2020-12-09 LAB — BASIC METABOLIC PANEL
Anion gap: 7 (ref 5–15)
BUN: 25 mg/dL — ABNORMAL HIGH (ref 8–23)
CO2: 25 mmol/L (ref 22–32)
Calcium: 8.2 mg/dL — ABNORMAL LOW (ref 8.9–10.3)
Chloride: 105 mmol/L (ref 98–111)
Creatinine, Ser: 1.1 mg/dL — ABNORMAL HIGH (ref 0.44–1.00)
GFR, Estimated: 51 mL/min — ABNORMAL LOW (ref >60)
Glucose, Bld: 123 mg/dL — ABNORMAL HIGH (ref 70–99)
Potassium: 4.4 mmol/L (ref 3.5–5.1)
Sodium: 137 mmol/L (ref 135–145)

## 2020-12-09 NOTE — Discharge Summary (Signed)
Date of admission: 12/08/2020  Date of discharge: 12/09/2020  Admission diagnosis: Pelvic organ prolapse  Discharge diagnosis: Pelvic organ prolapse  Secondary diagnoses:  Patient Active Problem List   Diagnosis Date Noted   Pelvic organ prolapse quantification stage 2 cystocele 12/08/2020   Incarcerated inguinal hernia, unilateral 12/20/2018   Chronic fatigue 11/02/2016   Cystocele, midline 04/10/2016   Vaginal atrophy 04/10/2016   SUI (stress urinary incontinence, female) 04/10/2016   Urinary incontinence in female 04/10/2016   Recurrent UTI 04/10/2016   Tachycardia 06/11/2011   Essential hypertension 04/25/2009   ELECTROCARDIOGRAM, ABNORMAL 04/25/2009    History and Physical: For full details, please see admission history and physical. Briefly, Lorraine Ferguson is a 80 y.o. patient with pelvic organ prolapse and inguinal hernia.  Underwent robotic assisted sacrocolpopexy by Dr. Erlene Quan and robotic assisted hernia repair by Dr. Mariah Milling Course: Patient tolerated the procedures well.  She was then transferred to the floor after an uneventful PACU stay.  Her hospital course was uncomplicated.  On POD#1 she had met discharge criteria: was eating a regular diet, was up and ambulating independently,  pain was well controlled, was voiding without a catheter, and was ready to for discharge.  Vaginal packing was removed POD #1 and PVR bladder scan was 35 mL   Laboratory values:  Recent Labs    12/09/20 0500  WBC 9.3  HGB 12.0  HCT 36.5   Recent Labs    12/09/20 0500  NA 137  K 4.4  CL 105  CO2 25  GLUCOSE 123*  BUN 25*  CREATININE 1.10*  CALCIUM 8.2*   No results for input(s): LABPT, INR in the last 72 hours. No results for input(s): LABURIN in the last 72 hours. Results for orders placed or performed during the hospital encounter of 12/08/20  Resp Panel by RT-PCR (Flu A&B, Covid) Nasopharyngeal Swab     Status: None   Collection Time: 12/08/20  3:23 PM    Specimen: Nasopharyngeal Swab; Nasopharyngeal(NP) swabs in vial transport medium  Result Value Ref Range Status   SARS Coronavirus 2 by RT PCR NEGATIVE NEGATIVE Final    Comment: (NOTE) SARS-CoV-2 target nucleic acids are NOT DETECTED.  The SARS-CoV-2 RNA is generally detectable in upper respiratory specimens during the acute phase of infection. The lowest concentration of SARS-CoV-2 viral copies this assay can detect is 138 copies/mL. A negative result does not preclude SARS-Cov-2 infection and should not be used as the sole basis for treatment or other patient management decisions. A negative result may occur with  improper specimen collection/handling, submission of specimen other than nasopharyngeal swab, presence of viral mutation(s) within the areas targeted by this assay, and inadequate number of viral copies(<138 copies/mL). A negative result must be combined with clinical observations, patient history, and epidemiological information. The expected result is Negative.  Fact Sheet for Patients:  EntrepreneurPulse.com.au  Fact Sheet for Healthcare Providers:  IncredibleEmployment.be  This test is no t yet approved or cleared by the [place] FDA and  has been authorized for detection and/or diagnosis of SARS-CoV-2 by FDA under an Emergency Use Authorization (EUA). This EUA will remain  in effect (meaning this test can be used) for the duration of the COVID-19 declaration under Section 564(b)(1) of the Act, 21 U.S.C.section 360bbb-3(b)(1), unless the authorization is terminated  or revoked sooner.       Influenza A by PCR NEGATIVE NEGATIVE Final   Influenza B by PCR NEGATIVE NEGATIVE Final    Comment: (NOTE) The  Xpert Xpress SARS-CoV-2/FLU/RSV plus assay is intended as an aid in the diagnosis of influenza from Nasopharyngeal swab specimens and should not be used as a sole basis for treatment. Nasal washings and aspirates are  unacceptable for Xpert Xpress SARS-CoV-2/FLU/RSV testing.  Fact Sheet for Patients: EntrepreneurPulse.com.au  Fact Sheet for Healthcare Providers: IncredibleEmployment.be  This test is not yet approved or cleared by the [place] FDA and has been authorized for detection and/or diagnosis of SARS-CoV-2 by FDA under an Emergency Use Authorization (EUA). This EUA will remain in effect (meaning this test can be used) for the duration of the COVID-19 declaration under Section 564(b)(1) of the Act, 21 U.S.C. section 360bbb-3(b)(1), unless the authorization is terminated or revoked.  Performed at [facility], [address] [place], [place] [address]     Disposition: Home  Discharge instruction: As per the chart discharge instructions  Discharge medications:  Allergies as of 12/09/2020       Reactions   Levofloxacin    Caused arm pain.        Medication List     TAKE these medications    amLODipine 5 MG tablet Commonly known as: NORVASC Take 1 tablet (5 mg total) by mouth daily.   amphetamine-dextroamphetamine 10 MG tablet Commonly known as: ADDERALL Take 10 mg by mouth 2 (two) times a day.   aspirin EC 81 MG tablet Take 1 tablet (81 mg total) by mouth daily.   bisoprolol-hydrochlorothiazide 10-6.25 MG tablet Commonly known as: ZIAC Take 1 tablet by mouth daily.   cyanocobalamin 100 MCG tablet Take 100 mcg by mouth daily.   fexofenadine 180 MG tablet Commonly known as: ALLEGRA Take 180 mg by mouth daily as needed for allergies or rhinitis.   Gemtesa 75 MG Tabs Generic drug: Vibegron Take 75 mg by mouth daily.   HYDROcodone-acetaminophen 5-325 MG tablet Commonly known as: NORCO/VICODIN Take 1-2 tablets by mouth every 6 (six) hours as needed for moderate pain.   levothyroxine 25 MCG tablet Commonly known as: SYNTHROID Take 25 mcg by mouth every morning.   losartan-hydrochlorothiazide 100-12.5 MG  tablet Commonly known as: HYZAAR Take 1 tablet by mouth daily.   Premarin vaginal cream Generic drug: conjugated estrogens Place 1 Applicatorful vaginally daily. Use daily x 6 weeks post op        Followup: Scheduled with Dr. Erlene Quan 12/20/2020  Follow-up Information     Hollice Espy, MD Follow up in 1 month(s).   Specialty: Urology Contact information: [place] [phone]

## 2020-12-09 NOTE — Progress Notes (Signed)
Pt's PIV removed with tip intact. Discharge instructions explained with pt. Pt denies any questions or concerns. Pt to pick up meds from home pharmacy. Pt to schedule apt for 2 week follow up.

## 2020-12-09 NOTE — Progress Notes (Signed)
Urology  No complaints this morning other than mild pelvic soreness.  Catheter removed earlier this morning.  VSS, afebrile  Exam: Abdomen soft, dressings dry.  Perineal pad dry Vaginal packing removed and no blood noted  Impression: Doing well status post robotic assisted laparoscopic sacrocolpopexy On today after patient voids and PVR okay

## 2020-12-09 NOTE — Plan of Care (Signed)

## 2020-12-11 ENCOUNTER — Telehealth: Payer: Self-pay | Admitting: Urology

## 2020-12-11 MED ORDER — ESTRADIOL 0.1 MG/GM VA CREA: TOPICAL_CREAM | VAGINAL | 12 refills | Status: AC

## 2020-12-11 NOTE — Telephone Encounter (Signed)
Spoke with patient-discussed need PA for prescription, have not received any faxes. Sent in generic. Patient voiced understanding.

## 2020-12-11 NOTE — Telephone Encounter (Signed)
Patient called the office today and left a voice mail message that she has questions about her prescriptions after surgery with Dr. [name] on 6/10.  Please call her at [phone].

## 2020-12-20 ENCOUNTER — Ambulatory Visit (INDEPENDENT_AMBULATORY_CARE_PROVIDER_SITE_OTHER): Payer: Medicare Other | Admitting: Urology

## 2020-12-20 ENCOUNTER — Encounter: Payer: Self-pay | Admitting: Urology

## 2020-12-20 ENCOUNTER — Other Ambulatory Visit: Payer: Self-pay

## 2020-12-20 VITALS — BP 162/80 | HR 59 | Ht <= 58 in | Wt 134.0 lb

## 2020-12-20 DIAGNOSIS — N952 Postmenopausal atrophic vaginitis: Secondary | ICD-10-CM

## 2020-12-20 DIAGNOSIS — N8110 Cystocele, unspecified: Secondary | ICD-10-CM

## 2020-12-20 DIAGNOSIS — R32 Unspecified urinary incontinence: Secondary | ICD-10-CM

## 2020-12-20 LAB — BLADDER SCAN AMB NON-IMAGING: Scan Result: 90

## 2020-12-20 NOTE — Progress Notes (Signed)
12/20/2020 10:15 AM   Lorraine Ferguson 07/01/1941 [id number]  Referring provider: Marguarite Arbour, MD [address] [facility] [place],  [place] [address]  Chief Complaint  Patient presents with   Routine Post Op    HPI: 80 year old female who presents today 2 weeks status post robotic sacrocolpopexy and right inguinal hernia repair.  Since surgery, she is only to take Tylenol.  Pain is well controlled.  Initially, she had some constipation for which she took MiraLAX and this resolved.  Over the past several days, she feels like her constipation is recurring.  She is trying to drink plenty of water.  She denies any vaginal bulging today.  She does report ongoing urinary leakage although is somewhat of a difficult historian today regarding her urinary issues.  She stopped taking Gemtesa about 4 days ago because she ran out and it was too expensive to continue.  She has been using estrogen cream daily.  She has questions about the dosage.   Results for orders placed or performed in visit on 12/20/20  Bladder Scan (Post Void Residual) in office  Result Value Ref Range   Scan Result 90       PMH: Past Medical History:  Diagnosis Date   Chiari malformation    hx with cervical syringomyelia, s/p surgery at [facility] 10-12 years ago   Depression    Fibrocystic breast disease    HTN (hypertension)    Hyperparathyroidism    s/p parathyroidectomy in 9/10   Hypothyroidism    Normal echocardiogram    EF 50-55%, mild LVH, trivial AI, no other valvular disease   RBBB (right bundle branch block with left anterior fascicular block)    Recurrent UTI    and urinary incontinence    Surgical History: Past Surgical History:  Procedure Laterality Date   ABDOMINAL HYSTERECTOMY     APPENDECTOMY     chiari malformation     hysterectomy (other)     INGUINAL HERNIA REPAIR Right 12/20/2018   Procedure: HERNIA REPAIR INGUINAL INCARCERATED;  Surgeon: Carolan Shiver, MD;   Location: [facility] ORS;  Service: General;  Laterality: Right;   leg vein     parathyroid surgery     ROBOTIC ASSISTED LAPAROSCOPIC SACROCOLPOPEXY N/A 12/08/2020   Procedure: XI ROBOTIC ASSISTED LAPAROSCOPIC SACROCOLPOPEXY;  Surgeon: Vanna Scotland, MD;  Location: [facility] ORS;  Service: Urology;  Laterality: N/A;   XI ROBOTIC ASSISTED INGUINAL HERNIA REPAIR WITH MESH Right 12/08/2020   Procedure: XI ROBOTIC ASSISTED vs Open INGUINAL HERNIA REPAIR WITH MESH;  Surgeon: Carolan Shiver, MD;  Location: [facility] ORS;  Service: General;  Laterality: Right;    Home Medications:  Allergies as of 12/20/2020       Reactions   Levofloxacin    Caused arm pain.        Medication List        Accurate as of December 20, 2020 10:15 AM. If you have any questions, ask your nurse or doctor.          STOP taking these medications    Premarin vaginal cream Generic drug: conjugated estrogens Stopped by: Vanna Scotland, MD       TAKE these medications    amLODipine 5 MG tablet Commonly known as: NORVASC Take 1 tablet (5 mg total) by mouth daily.   amphetamine-dextroamphetamine 10 MG tablet Commonly known as: ADDERALL Take 10 mg by mouth 2 (two) times a day.   aspirin EC 81 MG tablet Take 1 tablet (81 mg total)  by mouth daily.   bisoprolol-hydrochlorothiazide 10-6.25 MG tablet Commonly known as: ZIAC Take 1 tablet by mouth daily.   cyanocobalamin 100 MCG tablet Take 100 mcg by mouth daily.   estradiol 0.1 MG/GM vaginal cream Commonly known as: ESTRACE Place 1 Applicatorful vaginally daily. Use daily x 6 weeks post op   fexofenadine 180 MG tablet Commonly known as: ALLEGRA Take 180 mg by mouth daily as needed for allergies or rhinitis.   Gemtesa 75 MG Tabs Generic drug: Vibegron Take 75 mg by mouth daily.   HYDROcodone-acetaminophen 5-325 MG tablet Commonly known as: NORCO/VICODIN Take 1-2 tablets by mouth every 6 (six) hours as needed for moderate pain.   levothyroxine 25 MCG  tablet Commonly known as: SYNTHROID Take 25 mcg by mouth every morning.   losartan-hydrochlorothiazide 100-12.5 MG tablet Commonly known as: HYZAAR Take 1 tablet by mouth daily.        Allergies:  Allergies  Allergen Reactions   Levofloxacin     Caused arm pain.    Family History: Family History  Problem Relation Age of Onset   Alzheimer's disease Mother    Stroke Mother    Breast cancer Mother    Heart failure Father    Ovarian cancer Neg Hx    Colon cancer Neg Hx    Diabetes Neg Hx     Social History:  reports that she has never smoked. She has never used smokeless tobacco. She reports that she does not drink alcohol and does not use drugs.   Physical Exam: BP (!) 162/80   Pulse (!) 59   Ht 4\' 9"  (1.448 m)   Wt 134 lb (60.8 kg)   BMI 29.00 kg/m   Constitutional:  Alert and oriented, No acute distress. HEENT:  AT, moist mucus membranes.  Trachea midline, no masses. Cardiovascular: No clubbing, cyanosis, or edema. Respiratory: Normal respiratory effort, no increased work of breathing. GI: Abdomen is soft, incisions well-healed, nontender without rebound or guarding Pelvic: Chaperoned by , CMA.  Normal external genitalia.  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  Vaginal atrophy appreciated. Skin: No rashes, bruises or suspicious lesions. Neurologic: Grossly intact, no focal deficits, moving all 4 extremities. Psychiatric: Normal mood and affect.   Assessment & Plan:    1. 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  With Valsalva, there is prolapse to the introitus but not beyond.  Clinically, she reports less vaginal bulging/pressure.  Will continue to monitor the symptoms  Continue estrogen cream daily for 6 weeks and then will taper off  thereafter - Bladder Scan (Post Void Residual) in office  2. Vaginal atrophy As above  3. Urinary incontinence in female 6 weeks of Gemtesa 75 mg samples given today, reassess urinary symptoms follow-up    F/u 4 weeks  Debara Pickett, MD  [facility] [address] [place], [place] [id number]

## 2021-01-24 ENCOUNTER — Ambulatory Visit (INDEPENDENT_AMBULATORY_CARE_PROVIDER_SITE_OTHER): Payer: Medicare Other | Admitting: Urology

## 2021-01-24 ENCOUNTER — Other Ambulatory Visit: Payer: Self-pay

## 2021-01-24 VITALS — BP 149/86 | HR 68 | Ht <= 58 in | Wt 134.0 lb

## 2021-01-24 DIAGNOSIS — R32 Unspecified urinary incontinence: Secondary | ICD-10-CM

## 2021-01-24 DIAGNOSIS — N3946 Mixed incontinence: Secondary | ICD-10-CM

## 2021-01-24 DIAGNOSIS — N952 Postmenopausal atrophic vaginitis: Secondary | ICD-10-CM

## 2021-01-24 DIAGNOSIS — N8110 Cystocele, unspecified: Secondary | ICD-10-CM

## 2021-01-24 LAB — BLADDER SCAN AMB NON-IMAGING: Scan Result: 60

## 2021-01-24 NOTE — Progress Notes (Signed)
01/24/2021 2:05 PM   [name] 10-Apr-1941 [id number]  Referring provider: Marguarite Arbour, MD [address] [facility] [place],  [place] [address]  Chief Complaint  Patient presents with   Urinary Incontinence    HPI: 80 year old female who returns today for follow-up.  She underwent robotic sacrocolpopexy for vaginal vault prolapse.  She does report that she is doing actually quite well from this and has not had any recurrent symptomatic prolapse.  She continues to complain of right lower quadrant fullness at the site of her hernia repair.  She has no vaginal symptoms and is using estrogen cream "when she remembers".    In terms of urinary leakage, her symptoms are essentially stable.  She continues on [place].  She is somewhat of a difficult historian reports that she is constantly wet and does not know if she is leaking continuously, if its worse with standing or exercising/straining or if she has the urge to void in the has large volume losses.  She thinks about 3 weeks ago, her symptoms started to get better and she thought it may be related to surgery but now things are back where she was at her previous baseline.    She is generally dissatisfied.  She would like to be dry.   PMH: Past Medical History:  Diagnosis Date   Chiari malformation    hx with cervical syringomyelia, s/p surgery at [facility] 10-12 years ago   Depression    Fibrocystic breast disease    HTN (hypertension)    Hyperparathyroidism    s/p parathyroidectomy in 9/10   Hypothyroidism    Normal echocardiogram    EF 50-55%, mild LVH, trivial AI, no other valvular disease   RBBB (right bundle branch block with left anterior fascicular block)    Recurrent UTI    and urinary incontinence    Surgical History: Past Surgical History:  Procedure Laterality Date   ABDOMINAL HYSTERECTOMY     APPENDECTOMY     chiari malformation     hysterectomy (other)     INGUINAL HERNIA REPAIR Right  12/20/2018   Procedure: HERNIA REPAIR INGUINAL INCARCERATED;  Surgeon: Carolan Shiver, MD;  Location: [facility] ORS;  Service: General;  Laterality: Right;   leg vein     parathyroid surgery     ROBOTIC ASSISTED LAPAROSCOPIC SACROCOLPOPEXY N/A 12/08/2020   Procedure: XI ROBOTIC ASSISTED LAPAROSCOPIC SACROCOLPOPEXY;  Surgeon: Vanna Scotland, MD;  Location: [facility] ORS;  Service: Urology;  Laterality: N/A;   XI ROBOTIC ASSISTED INGUINAL HERNIA REPAIR WITH MESH Right 12/08/2020   Procedure: XI ROBOTIC ASSISTED vs Open INGUINAL HERNIA REPAIR WITH MESH;  Surgeon: Carolan Shiver, MD;  Location: [facility] ORS;  Service: General;  Laterality: Right;    Home Medications:  Allergies as of 01/24/2021       Reactions   Levofloxacin    Caused arm pain.        Medication List        Accurate as of January 24, 2021  2:05 PM. If you have any questions, ask your nurse or doctor.          STOP taking these medications    solifenacin 10 MG tablet Commonly known as: VESICARE Stopped by: Vanna Scotland, MD       TAKE these medications    amLODipine 5 MG tablet Commonly known as: NORVASC Take 1 tablet (5 mg total) by mouth daily.   amphetamine-dextroamphetamine 10 MG tablet Commonly known as: ADDERALL Take 10 mg by mouth  2 (two) times a day.   aspirin EC 81 MG tablet Take 1 tablet (81 mg total) by mouth daily.   bisoprolol-hydrochlorothiazide 10-6.25 MG tablet Commonly known as: ZIAC Take 1 tablet by mouth daily.   cyanocobalamin 100 MCG tablet Take 100 mcg by mouth daily.   estradiol 0.1 MG/GM vaginal cream Commonly known as: ESTRACE Place 1 Applicatorful vaginally daily. Use daily x 6 weeks post op   fexofenadine 180 MG tablet Commonly known as: ALLEGRA Take 180 mg by mouth daily as needed for allergies or rhinitis.   Gemtesa 75 MG Tabs Generic drug: Vibegron Take 75 mg by mouth daily.   HYDROcodone-acetaminophen 5-325 MG tablet Commonly known as: NORCO/VICODIN Take  1-2 tablets by mouth every 6 (six) hours as needed for moderate pain.   levothyroxine 25 MCG tablet Commonly known as: SYNTHROID Take 25 mcg by mouth every morning.   losartan-hydrochlorothiazide 100-12.5 MG tablet Commonly known as: HYZAAR Take 1 tablet by mouth daily.        Allergies:  Allergies  Allergen Reactions   Levofloxacin     Caused arm pain.    Family History: Family History  Problem Relation Age of Onset   Alzheimer's disease Mother    Stroke Mother    Breast cancer Mother    Heart failure Father    Ovarian cancer Neg Hx    Colon cancer Neg Hx    Diabetes Neg Hx     Social History:  reports that she has never smoked. She has never used smokeless tobacco. She reports that she does not drink alcohol and does not use drugs.   Physical Exam: BP (!) 149/86   Pulse 68   Ht 4\' 9"  (1.448 m)   Wt 134 lb (60.8 kg)   BMI 29.00 kg/m   Constitutional:  Alert and oriented, No acute distress. HEENT: [place] AT, moist mucus membranes.  Trachea midline, no masses. Cardiovascular: No clubbing, cyanosis, or edema. Respiratory: Normal respiratory effort, no increased work of breathing. Skin: No rashes, bruises or suspicious lesions. Neurologic: Grossly intact, no focal deficits, moving all 4 extremities. Psychiatric: Normal mood and affect.   Assessment & Plan:    1. Urinary incontinence, mixed Persistent severe incontinence, likely component of both stress and urge although it is difficult to tease this out  At this point, she would likely benefit from urodynamics.  If there is a large stress component, she may benefit from a mid urethral sling.  Reminded her that the purpose of the correction of her vault prolapse would not likely improve her urinary symptoms and may exacerbate stress incontinence.   Do the timing of her hernia repair, there is not time for urodynamics prior to combined case.  She does recall of having these conversations but is still  dissatisfied.  Depending on urodynamics results, I may have her see my partner, Dr. for consideration of mid urethral sling.  Continue Gemtesa for urge incontinence. - BLADDER SCAN AMB NON-IMAGING - Ambulatory referral to Urology  2. Vaginal atrophy Continue topical estrogen cream  3. 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  Sherron Monday, MD  [facility] [address] [place], [place] [phone]

## 2021-01-24 NOTE — Patient Instructions (Signed)
Urodynamic Testing  What is urodynamic testing?      Urodynamic testing is a set of tests and X-rays. These tests help to find out how well your bladder and the part of your body that drains pee from the bladder (urethra) are working.  Why do I need this testing?  You may need these tests if you:   Are leaking pee (urine).   Have trouble starting or stopping peeing (urination).   Pee often or it hurts to pee.   Have urinary tract infections often.   Are not able to empty your bladder.   Have strong urges to pee.   Have a weak flow of pee.  How do I prepare for the tests?   Ask your doctor about:  ? Changing or stopping your normal medicines. This is important if you take diabetes medicines or blood thinners.  ? Whether you should arrive for the test having to pee.   Tell your doctor about:  ? Any allergies you have.  ? All medicines you are taking, including vitamins, herbs, eye drops, creams, and over-the-counter medicines.  ? Whether you are pregnant or may be pregnant.  What are the risks?  In general, these tests are safe. But some of the tests have risks. These may include:   Discomfort.   Feeling a need to pee often.   Bleeding.   Infection.   Allergic reactions to medicines or dyes.  How are the tests done?  The tests may be done in one visit or may be done over a few visits. You may be given an antibiotic medicine to help keep you from getting an infection.  The types of tests done may include:  Uroflowmetry  This test measures how much you pee and how long it takes. You will pee into a type of toilet or device that sends measurements to a computer.  Postvoid residual measurement  This test measures how much pee is left in your bladder after you pee. It may be done by:   Using sound waves and a computer to create pictures of your bladder (ultrasound).   Putting a thin, flexible tube (catheter) into your bladder to take out the pee that is left so it can be measured.  Cystometric  testing  This test measures how much pressure there is in your bladder before you pee and as you pee.   You may be given a medicine to numb the area (local anesthetic).   The area around the opening of your urethra will be cleaned.   A thin, flexible tube will be used to empty your bladder.   A flexible tube that can measure pressure will then be placed. Your bladder will be filled with germ-free water.   Pressure will be measured:  ? As your bladder fills.  ? When you feel the need to pee.  ? As your bladder is emptied.   In some cases, your bladder may be filled with a dye that shows up on X-rays (contrast material) so that X-ray pictures can be taken during the test.  Electromyogram  This test measures the activity of the nerves and muscles in your bladder and in the tube that empties your bladder. Sticky patches (electrodes) will be placed on your body to measure electrical activity.  What happens after the testing?   You should be able to go home right away.   You can do your usual activities.   You may be told to drink a glass   of water every 30 minutes. Do this for the first 2 hours you are home.   Taking a warm bath or using a warm, wet towel may relieve any discomfort.  Let your doctor know if you have:   Pain.   Blood in your pee.   Chills.   Fever.  What do my test results mean?  Talk with your doctor about what your results mean. These test results can help your doctor find out how well your bladder and the tube that empties your bladder are working. Your results and your symptoms can help your doctor find what might be causing your problems.  Questions to ask your doctor  Ask your doctor, or the department that is doing the test:   When will my results be ready?   How will I get my results?   What are my treatment options?   What other tests do I need?   What are my next steps?  Summary   Urodynamic testing is a set of tests and X-rays.   These tests help to find out how well your  bladder and the part of your body that drains pee from the bladder are working.   Your results and your symptoms can help your doctor find what might be causing your problems.   Talk with your doctor about what your results mean.  This information is not intended to replace advice given to you by your health care provider. Make sure you discuss any questions you have with your health care provider.  Document Revised: 10/06/2018 Document Reviewed: 04/21/2017  Elsevier Patient Education  2021 [employer]vier Inc[employer]

## 2021-03-28 ENCOUNTER — Other Ambulatory Visit: Payer: Self-pay | Admitting: Urology

## 2021-04-02 NOTE — Progress Notes (Signed)
04/04/21 1:09 PM   Lorraine Ferguson 09-09-1940 [id number]  Referring provider:  Marguarite Arbour, MD [address] [facility] [place],  [place] [address] Chief Complaint  Patient presents with   Follow-up     HPI: Lorraine Ferguson is a 80 y.o.female with a personal history of vaginal vault prolapse, urinary incontinence, and vaginal atrophy, who presents today for UDS results.  She is accompanied today by her husband.  She is s/p  robotic sacrocolpopexy for vaginal vault prolapse.  This was done concomitantly the time of ventral hernia repair with mesh.  He does report improvement in her vaginal prolapse symptoms.  UDS results revealed she held a max capacity of approximately 290 mls. Her first sensation was felt at 125 mls there was positive stress urinary incontinence. She leaked a mild amount from both coughing and valsalva. There was positive instability. She leaked moderately with position changed and getting up from the table after her UDS was complete. She was able to generate a voluntary contraction and void 169 mls with max flow of 5 ml. She has mildly increased EMG activity during voided. PVR was approx. 134 mls. Trabeculation was noted, mild trabeculation was noted,, no reflux was noted.   She has tried and failed multiple anticholingerics she is currently on [place].   She is accompanied by her husband today. She has no new urinary symptoms.    PMH: Past Medical History:  Diagnosis Date   Chiari malformation    hx with cervical syringomyelia, s/p surgery at [facility] 10-12 years ago   Depression    Fibrocystic breast disease    HTN (hypertension)    Hyperparathyroidism    s/p parathyroidectomy in 9/10   Hypothyroidism    Normal echocardiogram    EF 50-55%, mild LVH, trivial AI, no other valvular disease   RBBB (right bundle branch block with left anterior fascicular block)    Recurrent UTI    and urinary incontinence    Surgical History: Past Surgical  History:  Procedure Laterality Date   ABDOMINAL HYSTERECTOMY     APPENDECTOMY     chiari malformation     hysterectomy (other)     INGUINAL HERNIA REPAIR Right 12/20/2018   Procedure: HERNIA REPAIR INGUINAL INCARCERATED;  Surgeon: Carolan Shiver, MD;  Location: [facility] ORS;  Service: General;  Laterality: Right;   leg vein     parathyroid surgery     ROBOTIC ASSISTED LAPAROSCOPIC SACROCOLPOPEXY N/A 12/08/2020   Procedure: XI ROBOTIC ASSISTED LAPAROSCOPIC SACROCOLPOPEXY;  Surgeon: Vanna Scotland, MD;  Location: [facility] ORS;  Service: Urology;  Laterality: N/A;   XI ROBOTIC ASSISTED INGUINAL HERNIA REPAIR WITH MESH Right 12/08/2020   Procedure: XI ROBOTIC ASSISTED vs Open INGUINAL HERNIA REPAIR WITH MESH;  Surgeon: Carolan Shiver, MD;  Location: [facility] ORS;  Service: General;  Laterality: Right;    Home Medications:  Allergies as of 04/03/2021       Reactions   Levofloxacin    Caused arm pain.        Medication List        Accurate as of April 03, 2021 11:59 PM. If you have any questions, ask your nurse or doctor.          STOP taking these medications    amLODipine 5 MG tablet Commonly known as: NORVASC Stopped by: Vanna Scotland, MD   Gemtesa 75 MG Tabs Generic drug: Vibegron Stopped by: Vanna Scotland, MD   HYDROcodone-acetaminophen 5-325 MG tablet Commonly known as: NORCO/VICODIN Stopped  by: Vanna Scotland, MD   losartan-hydrochlorothiazide 100-12.5 MG tablet Commonly known as: HYZAAR Stopped by: Vanna Scotland, MD       TAKE these medications    amphetamine-dextroamphetamine 10 MG tablet Commonly known as: ADDERALL Take 10 mg by mouth 2 (two) times a day.   aspirin EC 81 MG tablet Take 1 tablet (81 mg total) by mouth daily.   bisoprolol-hydrochlorothiazide 10-6.25 MG tablet Commonly known as: ZIAC Take 1 tablet by mouth daily.   cyanocobalamin 100 MCG tablet Take 100 mcg by mouth daily.   estradiol 0.1 MG/GM vaginal cream Commonly known  as: ESTRACE Place 1 Applicatorful vaginally daily. Use daily x 6 weeks post op   fexofenadine 180 MG tablet Commonly known as: ALLEGRA Take 180 mg by mouth daily as needed for allergies or rhinitis.   levothyroxine 25 MCG tablet Commonly known as: SYNTHROID Take 25 mcg by mouth every morning.        Allergies:  Allergies  Allergen Reactions   Levofloxacin     Caused arm pain.    Family History: Family History  Problem Relation Age of Onset   Alzheimer's disease Mother    Stroke Mother    Breast cancer Mother    Heart failure Father    Ovarian cancer Neg Hx    Colon cancer Neg Hx    Diabetes Neg Hx     Social History:  reports that she has never smoked. She has never used smokeless tobacco. She reports that she does not drink alcohol and does not use drugs.   Physical Exam: BP (!) 164/79   Pulse 63   Constitutional:  Alert and oriented, No acute distress. HEENT: [place] AT, moist mucus membranes.  Trachea midline, no masses. Cardiovascular: No clubbing, cyanosis, or edema. Respiratory: Normal respiratory effort, no increased work of breathing. Skin: No rashes, bruises or suspicious lesions. Neurologic: Grossly intact, no focal deficits, moving all 4 extremities. Psychiatric: Normal mood and affect.  Laboratory Data:  Lab Results  Component Value Date   CREATININE 1.10 (H) 12/09/2020     Pertinent Imaging: Urodynamics were personally reviewed.    Assessment & Plan:    Mixed urge incontinence -Symptoms remain severe, significant bother  -Urodynamics supports both urge and stress incontinence which were both significant  -She is tried and failed multiple anticholinergics and beta 3 agonist.  At this point, we will continue to try to treat overactivity aggressively with second line options including PTNS versus Botox.  - Counseled her on Botox injections into bladder and the risk and benefits of this. Spoke about the injections.  We could try this in the  office.  She was provided with additional literature today.  She will think about whether or not to pursue this.  We did discuss the risk of urinary retention was approximately 5%.  - Recommend discussion with Dr. Sherron Monday for consideration of mid urethral sling vs. urethral bulking agent. She is a bit hesitant on this.     Tawni Millers as a Neurosurgeon for Vanna Scotland, MD.,have documented all relevant documentation on the behalf of Vanna Scotland, MD,as directed by  Vanna Scotland, MD while in the presence of Vanna Scotland, MD.   [facility] [address] [place], [place] [address] [phone]

## 2021-04-03 ENCOUNTER — Ambulatory Visit (INDEPENDENT_AMBULATORY_CARE_PROVIDER_SITE_OTHER): Payer: Medicare Other | Admitting: Urology

## 2021-04-03 ENCOUNTER — Other Ambulatory Visit: Payer: Self-pay

## 2021-04-03 VITALS — BP 164/79 | HR 63

## 2021-04-03 DIAGNOSIS — N8110 Cystocele, unspecified: Secondary | ICD-10-CM

## 2021-04-03 DIAGNOSIS — R32 Unspecified urinary incontinence: Secondary | ICD-10-CM | POA: Diagnosis not present

## 2021-04-03 DIAGNOSIS — N3946 Mixed incontinence: Secondary | ICD-10-CM

## 2021-04-16 ENCOUNTER — Other Ambulatory Visit: Payer: Self-pay

## 2021-04-16 ENCOUNTER — Encounter: Payer: Self-pay | Admitting: Urology

## 2021-04-16 ENCOUNTER — Ambulatory Visit (INDEPENDENT_AMBULATORY_CARE_PROVIDER_SITE_OTHER): Payer: Medicare Other | Admitting: Urology

## 2021-04-16 VITALS — BP 128/77 | HR 81

## 2021-04-16 DIAGNOSIS — N3946 Mixed incontinence: Secondary | ICD-10-CM

## 2021-04-16 NOTE — Progress Notes (Signed)
04/16/2021 3:12 PM   Lorraine Ferguson [birth year] [id number]  Referring provider: Marguarite Arbour, MD [address] [facility] [place],  [place] [address]  Chief Complaint  Patient presents with   Follow-up    HPI: Lorraine Ferguson: Patient had a robotic sacrocolpopexy with vaginal vault prolapse done at the time as a ventral hernia repair with mesh.  She had urodynamics with a bladder capacity 290 mL.  She had an overactive bladder.  She had stress incontinence.  She failed antimuscarinics and new beta 3 agonist.  Third line therapies were discussed.  In 2019 she was given Myrbetriq for mixed incontinence.  It looks like she may have had a right inguinal hernia repair.  It appears the patient had robotic assisted laparoscopic sacrocolpopexy with lysis of adhesions December 08, 2020.  The new beta 3 agonist prior to surgery had helped her urge incontinence but it was too expensive.  Her mixed think continence was documented prior to this and she was on Myrbetriq  Currently she leaks with coughing sneezing bending.  She says it leaks all the time.  She has significant foot on the floor syndrome when she stands.  She leaks without awareness.  She is not feeling urgency.  She has small volume bedwetting.  She can soak up to 4 pads a day.  She thinks the incontinence is not worse since the surgery on June 10 and she has been gradually getting worse over many years  She has had a hysterectomy  She voids every 2-3 hours and gets up once a night  No neurologic issues.  No previous bladder surgery otherwise.  No urinary tract infections  She had urodynamics on March 20, 2021 3 months after prolapse surgery.  Bladder capacity was 290 mL.  Bladder was unstable and had moderately severe leakage.  Her cough leak point pressure at 200 mL was 18 cm of water with mild leakage.  At 300 mL she had mild leakage at a cough leak point pressure 30 cm of water.  During voluntary voiding she voided 169 mL  with maximal flow 5 mils per second.  Max voiding pressure is 14 cm of water.  Residual was 134 mL.  Residual to begin of the test was 0.  Bladder neck descended 1 to 2 cm.  She leaked a moderate amount with positional changes and getting up from the table it appeared she was having recurrent bladder overactivity during the study.  On pelvic examination she had a small grade 1 cystocele with good length.  She could not cough hard but no stress incontinence which was promising.     PMH: Past Medical History:  Diagnosis Date   Chiari malformation    hx with cervical syringomyelia, s/p surgery at [facility] 10-12 years ago   Depression    Fibrocystic breast disease    HTN (hypertension)    Hyperparathyroidism    s/p parathyroidectomy in 9/10   Hypothyroidism    Normal echocardiogram    EF 50-55%, mild LVH, trivial AI, no other valvular disease   RBBB (right bundle branch block with left anterior fascicular block)    Recurrent UTI    and urinary incontinence    Surgical History: Past Surgical History:  Procedure Laterality Date   ABDOMINAL HYSTERECTOMY     APPENDECTOMY     chiari malformation     hysterectomy (other)     INGUINAL HERNIA REPAIR Right 12/20/2018   Procedure: HERNIA REPAIR INGUINAL INCARCERATED;  Surgeon: Carolan Shiver,  MD;  Location: [facility];  Service: General;  Laterality: Right;   leg vein     parathyroid surgery     ROBOTIC ASSISTED LAPAROSCOPIC SACROCOLPOPEXY N/A 12/08/2020   Procedure: XI ROBOTIC ASSISTED LAPAROSCOPIC SACROCOLPOPEXY;  Surgeon: Vanna Scotland, MD;  Location: [facility];  Service: Urology;  Laterality: N/A;   XI ROBOTIC ASSISTED INGUINAL HERNIA REPAIR WITH MESH Right 12/08/2020   Procedure: XI ROBOTIC ASSISTED vs Open INGUINAL HERNIA REPAIR WITH MESH;  Surgeon: Carolan Shiver, MD;  Location: [facility];  Service: General;  Laterality: Right;    Home Medications:  Allergies as of 04/16/2021       Reactions   Levofloxacin    Caused arm  pain.        Medication List        Accurate as of April 16, 2021  3:12 PM. If you have any questions, ask your nurse or doctor.          amphetamine-dextroamphetamine 10 MG tablet Commonly known as: ADDERALL Take 10 mg by mouth 2 (two) times a day.   aspirin EC 81 MG tablet Take 1 tablet (81 mg total) by mouth daily.   bisoprolol-hydrochlorothiazide 10-6.25 MG tablet Commonly known as: ZIAC Take 1 tablet by mouth daily.   cyanocobalamin 100 MCG tablet Take 100 mcg by mouth daily.   estradiol 0.1 MG/GM vaginal cream Commonly known as: ESTRACE Place 1 Applicatorful vaginally daily. Use daily x 6 weeks post op   fexofenadine 180 MG tablet Commonly known as: ALLEGRA Take 180 mg by mouth daily as needed for allergies or rhinitis.   levothyroxine 25 MCG tablet Commonly known as: SYNTHROID Take 25 mcg by mouth every morning.        Allergies:  Allergies  Allergen Reactions   Levofloxacin     Caused arm pain.    Family History: Family History  Problem Relation Age of Onset   Alzheimer's disease Mother    Stroke Mother    Breast cancer Mother    Heart failure Father    Ovarian cancer Neg Hx    Colon cancer Neg Hx    Diabetes Neg Hx     Social History:  reports that she has never smoked. She has never used smokeless tobacco. She reports that she does not drink alcohol and does not use drugs.  ROS:                                        Physical Exam: BP 128/77   Pulse 81     Laboratory Data: Lab Results  Component Value Date   WBC 9.3 12/09/2020   HGB 12.0 12/09/2020   HCT 36.5 12/09/2020   MCV 89.5 12/09/2020   PLT 275 12/09/2020    Lab Results  Component Value Date   CREATININE 1.10 (H) 12/09/2020    No results found for: PSA  No results found for: TESTOSTERONE  No results found for: HGBA1C  Urinalysis    Component Value Date/Time   COLORURINE YELLOW 02/27/2009 0946   APPEARANCEUR Hazy (A) 11/01/2020  1417   LABSPEC 1.012 02/27/2009 0946   PHURINE 6.5 02/27/2009 0946   GLUCOSEU Negative 11/01/2020 1417   HGBUR NH trace 04/10/2016 1038   HGBUR SMALL (A) 02/27/2009 0946   BILIRUBINUR Negative 11/01/2020 1417   KETONESUR NEGATIVE 02/27/2009 0946   PROTEINUR Negative 11/01/2020 1417   PROTEINUR NEGATIVE 02/27/2009 0946  UROBILINOGEN negative 04/10/2016 1038   UROBILINOGEN 0.2 02/27/2009 0946   NITRITE Negative 11/01/2020 1417   NITRITE NEGATIVE 02/27/2009 0946   LEUKOCYTESUR Negative 11/01/2020 1417    Pertinent Imaging:   Assessment & Plan: Patient has mixed incontinence.  She does have low leak point pressures.  By history and urodynamics she has a significant overactive bladder as well.  Based on this and her age I do not recommend a sling.  A bulking agent may be a good option for her.  I think percutaneous tibial nerve stimulation Botox or InterStim are good options to consider especially the first 2.  I like to have her come back for cystoscopy and try the new beta 3 agonist again.  If this helps her a lot we can give her plenty of samples.  Call if urine culture is positive  Refractory OAB treatment may be better than the bulking agent based on travel.  There is no question she is leak for years  There are no diagnoses linked to this encounter.  No follow-ups on file.  Martina Sinner, MD  [facility] [address] [place], [place] [address] [phone]

## 2021-04-16 NOTE — Patient Instructions (Signed)

## 2021-05-28 ENCOUNTER — Other Ambulatory Visit: Payer: Medicare Other | Admitting: Urology

## 2021-12-14 IMAGING — CT CT PELVIS W/O CM
2 of 3 series · 16 of 46 positions shown, 18 images · non-contrast
Comparison: An abdomen and pelvis CT dated 12/20/2018

CLINICAL DATA: Right inguinal swelling.

EXAM:
CT PELVIS WITHOUT CONTRAST
TECHNIQUE: Multidetector CT imaging of the pelvis was performed following the
standard protocol without intravenous contrast.

[Series 2: routine abd/pel wo · axial · 0.77mm/px · z∈[-453,-243]mm · 13 of 50 slices shown, 15 images]
[im 4/50  soft-tissue]
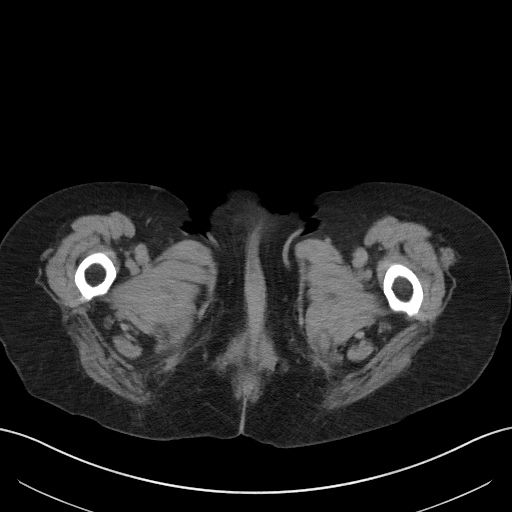
[im 4/50  bone]
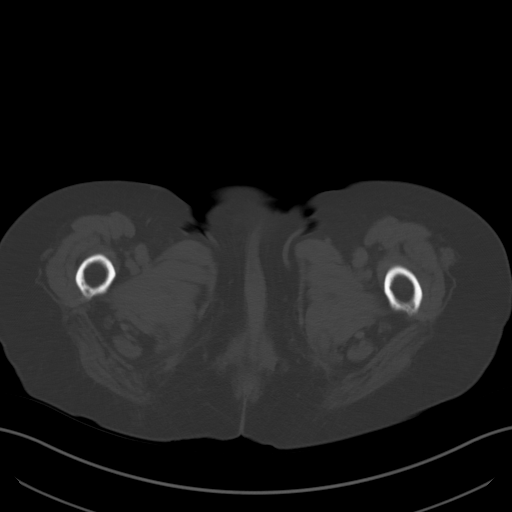
[im 7/50  soft-tissue]
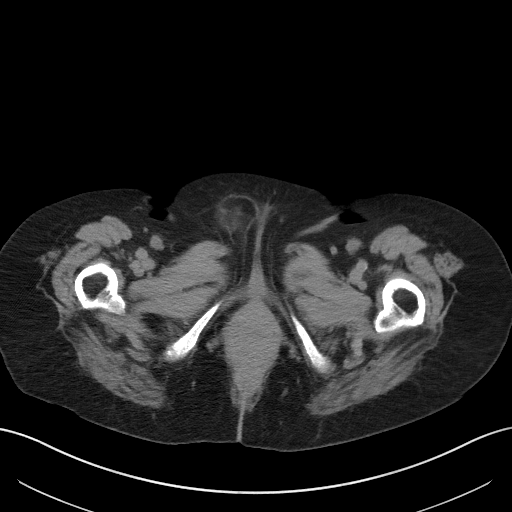
[im 10/50  soft-tissue]
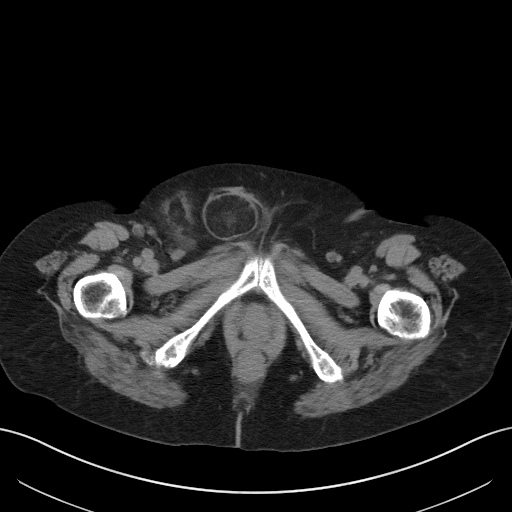
[im 15/50  soft-tissue]
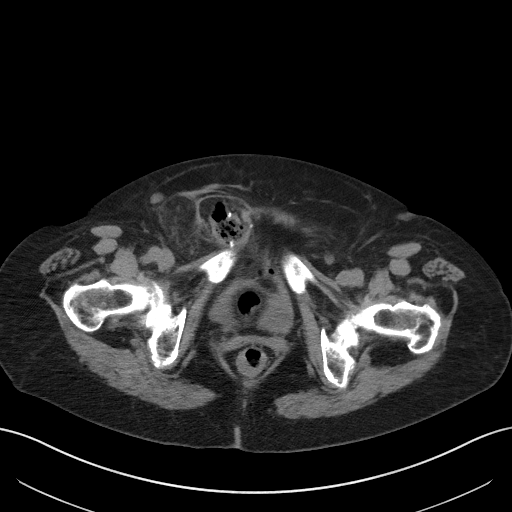
[im 18/50  soft-tissue]
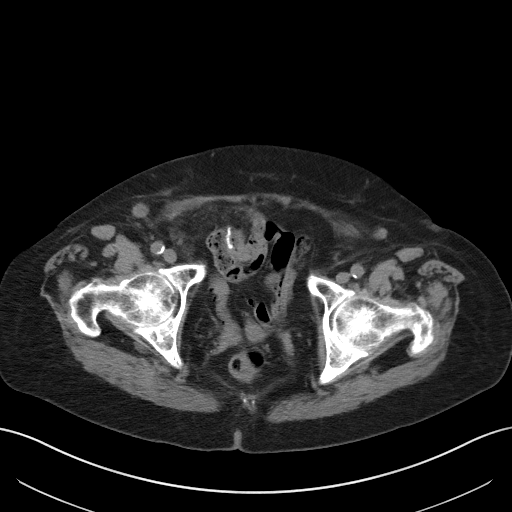
[im 21/50  soft-tissue]
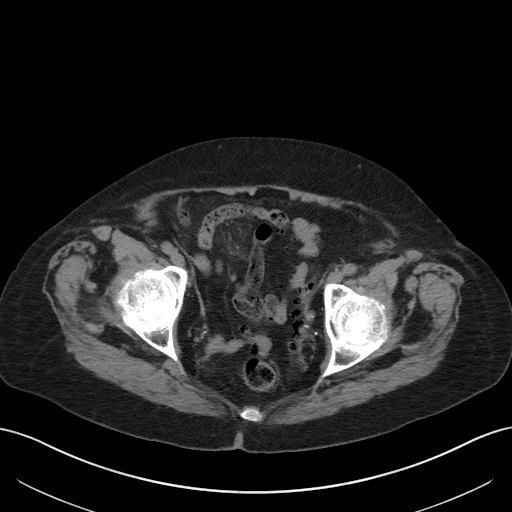
[im 26/50  soft-tissue]
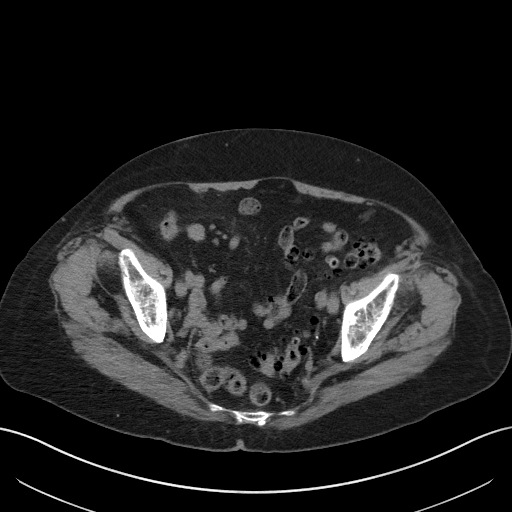
[im 29/50  soft-tissue]
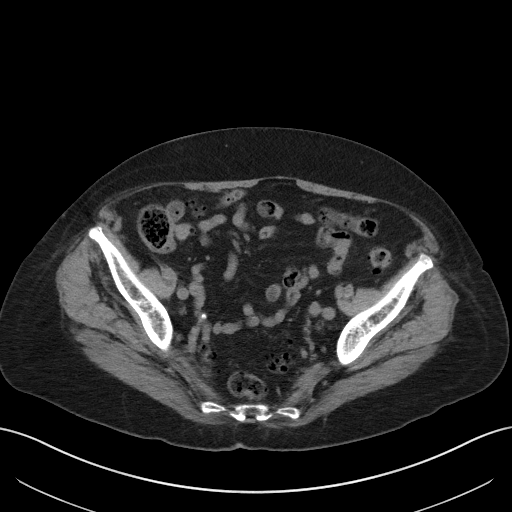
[im 32/50  soft-tissue]
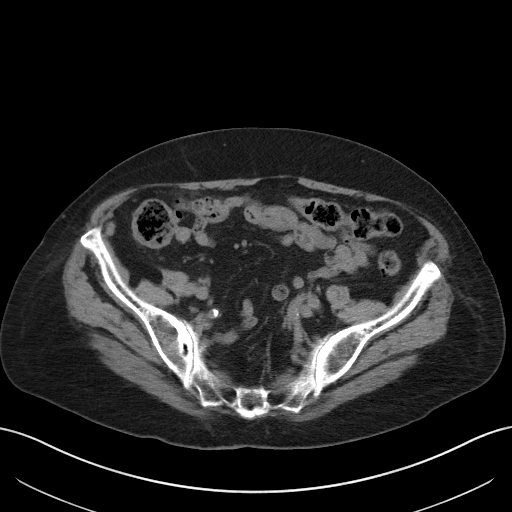
[im 32/50  bone]
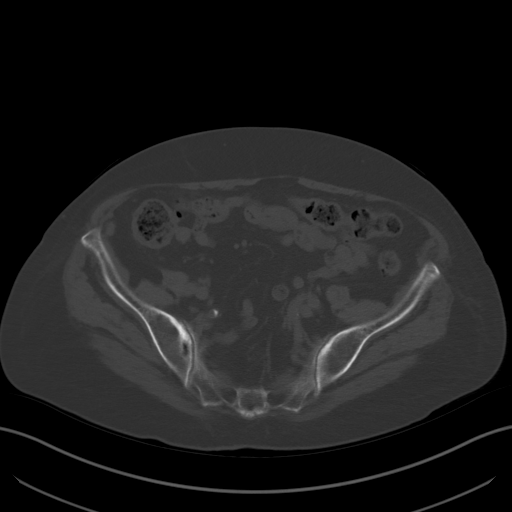
[im 35/50  soft-tissue]
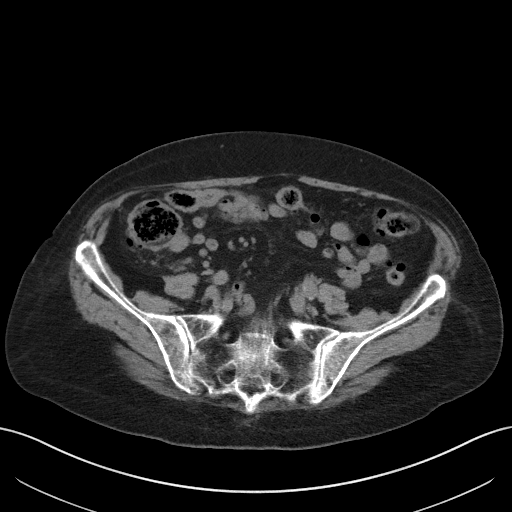
[im 40/50  soft-tissue]
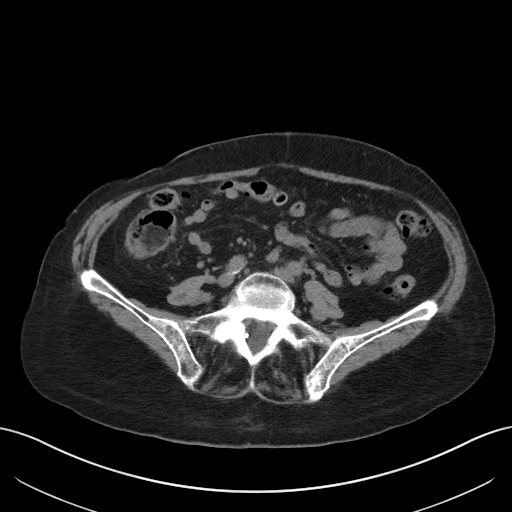
[im 43/50  soft-tissue]
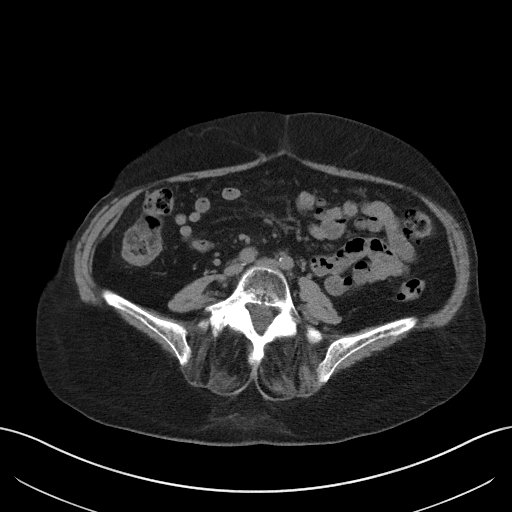
[im 46/50  soft-tissue]
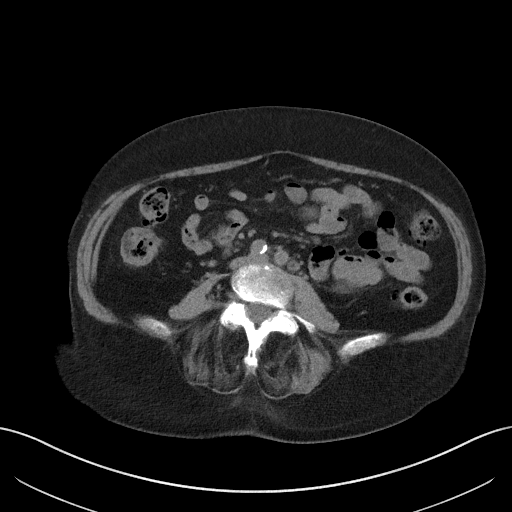

[Series 4: coronal st · coronal · 0.51mm/px · 3 of 91 slices shown]
[im 31/91  soft-tissue]
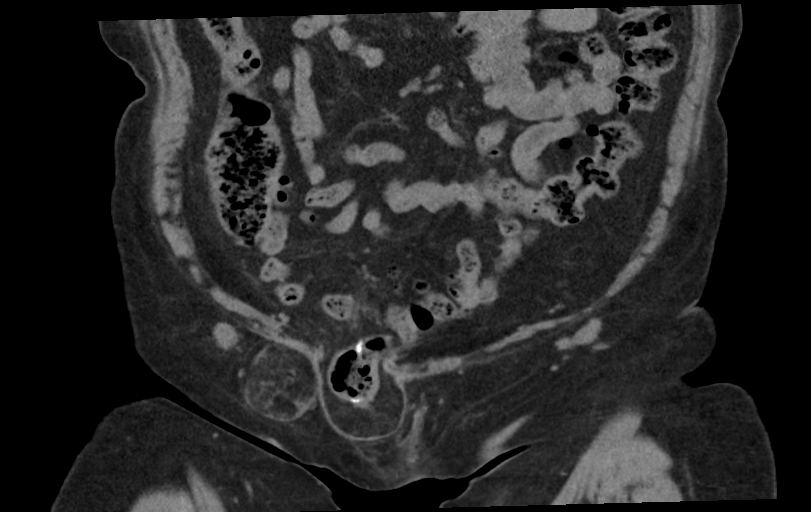
[im 41/91  soft-tissue]
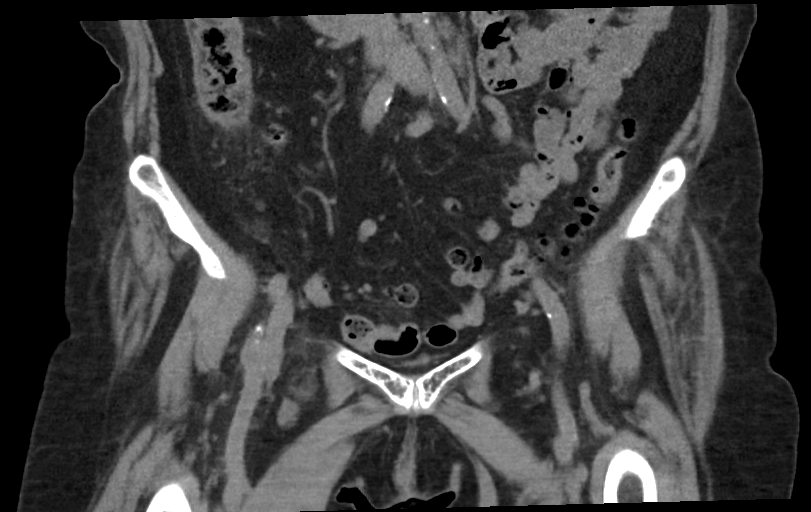
[im 51/91  soft-tissue]
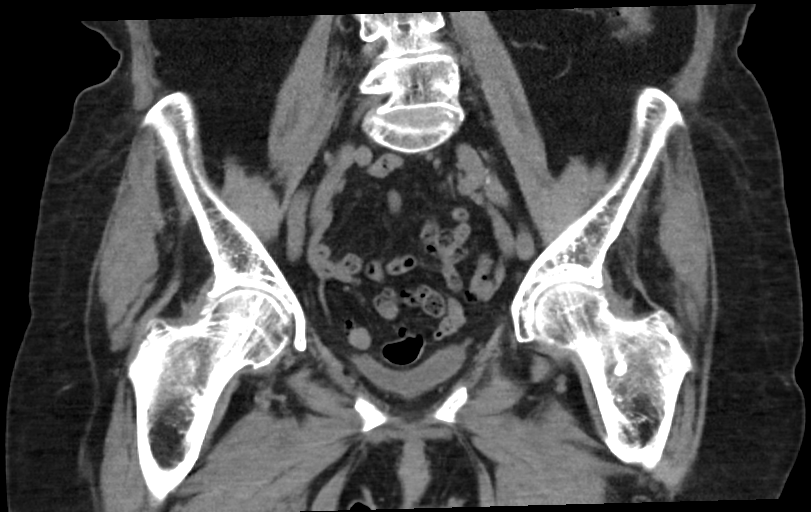

[16 of 46 positions shown; findings below may reference images not displayed]

FINDINGS: Urinary Tract: Decompressed urinary bladder containing a small
amount of air, possibly due to recent bladder catheterization.
Unremarkable distal ureters.

Bowel: Multiple sigmoid colon diverticula. Herniated loop of small
bowel containing fecalized material and a surgical anastomosis in a
right inguinal hernia. There is a an adjacent, more laterally
located hernia containing fat with mild edema. No bowel dilatation
or wall thickening.

Vascular/Lymphatic: Interval mildly prominent bilateral inguinal
lymph nodes. These do not appear pathologically enlarged.

Reproductive:  Surgically absent uterus.  No adnexal masses.

Other:  2 right groin hernias, as described above.

Musculoskeletal: Lower lumbar spine facet degenerative changes.
IMPRESSION: 1. Moderate-sized right inguinal hernia containing a portion of an
anastomotic small bowel loops with fecalized material. No evidence
of incarceration, bowel obstruction or ischemia.
2. Additional right groin hernia more laterally containing edematous
fat.
3. Sigmoid diverticulosis.

## 2022-03-07 NOTE — Progress Notes (Signed)
Cardiology Office Note  Date:  03/08/2022   ID:  Lorraine Ferguson, DOB [birth year], MRN [id number]  PCP:  Marguarite Arbour, MD   Chief Complaint  Patient presents with   12 month follow up     Patient c/o leg weakness when walking up an incline. Medications reviewed by the patient verbally.     HPI:  81 yo  woman with history of  HTN  Depression hyperparathyroidism s/p parathyroidectomy 9/10,    ABN EKG with RBBB and left anterior fascicular block on her ECG chronic fatigue despite once daily Adderall.  Chiari I malformation (CMS-HCC)  presents for routine followup of her hypertension and tachycardia  Last seen in clinic 2/22 "Likes to sit and sleep" Sedentary Has two dogs Does not like to clean house Husband does barn work/mow  Chronic leg weakness No recent falls Prior Rib fractures on x-ray from fall  On discussion of her blood pressure medications, reports she is on bisoprolol HCTZ alone, not on amlodipine  EKG personally reviewed by myself on todays visit Normal sinus rhythm rate 60 bpm left bundle branch block left axis deviation  Other past medical history reviewed  Bladder problem, wears pads  Previously reported that  food was sticking when she swallows.  EGD and colonoscopy with Dr. Mechele Collin 04/08/2013.  She has no new complaints.   PMH:   has a past medical history of Chiari malformation, Depression, Fibrocystic breast disease, HTN (hypertension), Hyperparathyroidism, Hypothyroidism, Normal echocardiogram, RBBB (right bundle branch block with left anterior fascicular block), and Recurrent UTI.  PSH:    Past Surgical History:  Procedure Laterality Date   ABDOMINAL HYSTERECTOMY     APPENDECTOMY     chiari malformation     hysterectomy (other)     INGUINAL HERNIA REPAIR Right 12/20/2018   Procedure: HERNIA REPAIR INGUINAL INCARCERATED;  Surgeon: Carolan Shiver, MD;  Location: [facility];  Service: General;  Laterality: Right;   leg vein     parathyroid  surgery     ROBOTIC ASSISTED LAPAROSCOPIC SACROCOLPOPEXY N/A 12/08/2020   Procedure: XI ROBOTIC ASSISTED LAPAROSCOPIC SACROCOLPOPEXY;  Surgeon: Vanna Scotland, MD;  Location: [facility];  Service: Urology;  Laterality: N/A;   XI ROBOTIC ASSISTED INGUINAL HERNIA REPAIR WITH MESH Right 12/08/2020   Procedure: XI ROBOTIC ASSISTED vs Open INGUINAL HERNIA REPAIR WITH MESH;  Surgeon: Carolan Shiver, MD;  Location: [facility];  Service: General;  Laterality: Right;    Current Outpatient Medications  Medication Sig Dispense Refill   amphetamine-dextroamphetamine (ADDERALL) 10 MG tablet Take 10 mg by mouth 2 (two) times a day.     aspirin EC 81 MG tablet Take 1 tablet (81 mg total) by mouth daily.     bisoprolol-hydrochlorothiazide (ZIAC) 10-6.25 MG tablet Take 1 tablet by mouth daily.     cyanocobalamin 100 MCG tablet Take 100 mcg by mouth daily.     fexofenadine (ALLEGRA) 180 MG tablet Take 180 mg by mouth daily as needed for allergies or rhinitis.     levothyroxine (SYNTHROID) 25 MCG tablet Take 25 mcg by mouth every morning.     estradiol (ESTRACE) 0.1 MG/GM vaginal cream Place 1 Applicatorful vaginally daily. Use daily x 6 weeks post op (Patient not taking: Reported on 03/08/2022) 42.5 g 12   No current facility-administered medications for this visit.    Allergies:   Levofloxacin   Social History:  The patient  reports that she has never smoked. She has never used smokeless tobacco. She reports that she does not drink  alcohol and does not use drugs.   Family History:   family history includes Alzheimer's disease in her mother; Breast cancer in her mother; Heart failure in her father; Stroke in her mother.    Review of Systems: Review of Systems  Constitutional: Negative.   Respiratory: Negative.    Cardiovascular: Negative.   Gastrointestinal: Negative.   Musculoskeletal: Negative.   Neurological: Negative.   Psychiatric/Behavioral:  Positive for depression.   All other systems  reviewed and are negative.   PHYSICAL EXAM: VS:  BP (!) 140/80 (BP Location: Left Arm, Patient Position: Sitting, Cuff Size: Normal)   Pulse 60   Ht 4\' 11"  (1.499 m)   Wt 130 lb 4 oz (59.1 kg)   SpO2 98%   BMI 26.31 kg/m  , BMI Body mass index is 26.31 kg/m. Constitutional:  oriented to person, place, and time. No distress.  HENT:  Head: Grossly normal Eyes:  no discharge. No scleral icterus.  Neck: No JVD, no carotid bruits  Cardiovascular: Regular rate and rhythm, no murmurs appreciated Pulmonary/Chest: Clear to auscultation bilaterally, no wheezes or rails Abdominal: Soft.  no distension.  no tenderness.  Musculoskeletal: Normal range of motion Neurological:  normal muscle tone. Coordination normal. No atrophy Skin: Skin warm and dry Psychiatric: normal affect, pleasant  Recent Labs: No results found for requested labs within last 365 days.    Lipid Panel Lab Results  Component Value Date   CHOL 195 05/09/2009   HDL 58 05/09/2009   LDLCALC 120 (H) 05/09/2009   TRIG 87 05/09/2009      Wt Readings from Last 3 Encounters:  03/08/22 130 lb 4 oz (59.1 kg)  01/24/21 134 lb (60.8 kg)  12/20/20 134 lb (60.8 kg)      ASSESSMENT AND PLAN:  Essential hypertension - Plan: EKG 12-Lead Recommend she start to monitor blood pressure at home Continue bisoprolol HCTZ Appears she is no longer taking amlodipine, this could be restarted for hypertension  Abnormal EKG - Plan: EKG 12-Lead Right bundle branch block No further work-up needed  Chronic fatigue - Plan: EKG 12-Lead Sedentary lifestyle, depression No motivation to walk, cleaning house On Adderall  Obesity We have encouraged continued exercise, careful diet management in an effort to lose weight.  Leg swelling None on today's visit   Total encounter time more than 30 minutes  Greater than 50% was spent in counseling and coordination of care with the patient    Orders Placed This Encounter  Procedures    EKG 12-Lead    Total encounter time more than 25 minutes  Greater than 50% was spent in counseling and coordination of care with the patient   Signed, 12/22/20, M.D., Ph.D. 03/08/2022  [facility] [place], [place]

## 2022-03-08 ENCOUNTER — Encounter: Payer: Self-pay | Admitting: Cardiovascular Disease

## 2022-03-08 ENCOUNTER — Ambulatory Visit: Payer: Medicare Other | Attending: Cardiovascular Disease | Admitting: Cardiovascular Disease

## 2022-03-08 VITALS — BP 140/80 | HR 60 | Ht 59.0 in | Wt 130.2 lb

## 2022-03-08 DIAGNOSIS — I10 Essential (primary) hypertension: Secondary | ICD-10-CM | POA: Diagnosis not present

## 2022-03-08 DIAGNOSIS — R000 Tachycardia, unspecified: Secondary | ICD-10-CM

## 2022-03-08 DIAGNOSIS — R5382 Chronic fatigue, unspecified: Secondary | ICD-10-CM | POA: Diagnosis not present

## 2022-03-08 NOTE — Patient Instructions (Addendum)
Medication Instructions:  No changes  If you need a refill on your cardiac medications before your next appointment, please call your pharmacy.   Lab work: No new labs needed  Testing/Procedures: No new testing needed  Follow-Up: At [facility], you and your health needs are our priority.  As part of our continuing mission to provide you with exceptional heart care, we have created designated Provider Care Teams.  These Care Teams include your primary Cardiologist (physician) and Advanced Practice Providers (APPs -  Physician Assistants and Nurse Practitioners) who all work together to provide you with the care you need, when you need it.  You will need a follow up appointment as needed  Providers on your designated Care Team:   Christopher Berge, NP Ryan Dunn, PA-C Cadence Furth, PA-C  COVID-19 Vaccine Information can be found at: [email or web address] For questions related to vaccine distribution or appointments, please email [email or web address] or call [phone].    

## 2023-08-22 ENCOUNTER — Ambulatory Visit (INDEPENDENT_AMBULATORY_CARE_PROVIDER_SITE_OTHER): Payer: Medicare Other | Admitting: Vascular Surgery

## 2023-08-22 ENCOUNTER — Encounter (INDEPENDENT_AMBULATORY_CARE_PROVIDER_SITE_OTHER): Payer: Self-pay | Admitting: Vascular Surgery

## 2023-08-22 VITALS — BP 190/97 | HR 68 | Resp 18 | Ht 59.0 in | Wt 126.6 lb

## 2023-08-22 DIAGNOSIS — I10 Essential (primary) hypertension: Secondary | ICD-10-CM

## 2023-08-22 DIAGNOSIS — M7989 Other specified soft tissue disorders: Secondary | ICD-10-CM

## 2023-08-22 NOTE — Progress Notes (Signed)
Patient ID: Lorraine Ferguson, female   DOB: Jan 22, 1941, 83 y.o.   MRN: [id number]  Chief Complaint  Patient presents with   New Patient (Initial Visit)    np. consult. leg swelling. ref: Sparks,Jeffrey    HPI ILLENE Ferguson is a 83 y.o. female.  I am asked to see the patient by Dr. Judithann Sheen for evaluation of leg swelling.  The patient is had intermittent leg swelling for some years now.  However, over the past 2 to 3 months her swelling is become more pronounced.  Over the past month or 6 weeks she has noticed significant dark discoloration of both lower extremities.  She is fairly fixated on what appeared to be some actinic keratosis or nonmelanoma skin cancers in the lower legs and whether or not she can remove these.  She was told she needed to get her swelling better before these were removed and I would agree with that.  She is not a great historian.  It sounds like she previously had a venous intervention performed with what sounds like vein stripping many years ago on the right leg.  She does not wear compression socks but says she does have some at home she thinks.  She does not really elevate her legs.  She also says she has not been very active and has not walked much over the past few months.  No current open wounds or infection.  No fevers or chills.  No chest pain or shortness of breath.     Past Medical History:  Diagnosis Date   Chiari malformation    hx with cervical syringomyelia, s/p surgery at [facility] 10-12 years ago   Depression    Fibrocystic breast disease    HTN (hypertension)    Hyperparathyroidism    s/p parathyroidectomy in 9/10   Hypothyroidism    Normal echocardiogram    EF 50-55%, mild LVH, trivial AI, no other valvular disease   RBBB (right bundle branch block with left anterior fascicular block)    Recurrent UTI    and urinary incontinence    Past Surgical History:  Procedure Laterality Date   ABDOMINAL HYSTERECTOMY     APPENDECTOMY     chiari malformation      hysterectomy (other)     INGUINAL HERNIA REPAIR Right 12/20/2018   Procedure: HERNIA REPAIR INGUINAL INCARCERATED;  Surgeon: Carolan Shiver, MD;  Location: [facility];  Service: General;  Laterality: Right;   leg vein     parathyroid surgery     ROBOTIC ASSISTED LAPAROSCOPIC SACROCOLPOPEXY N/A 12/08/2020   Procedure: XI ROBOTIC ASSISTED LAPAROSCOPIC SACROCOLPOPEXY;  Surgeon: Vanna Scotland, MD;  Location: [facility];  Service: Urology;  Laterality: N/A;   XI ROBOTIC ASSISTED INGUINAL HERNIA REPAIR WITH MESH Right 12/08/2020   Procedure: XI ROBOTIC ASSISTED vs Open INGUINAL HERNIA REPAIR WITH MESH;  Surgeon: Carolan Shiver, MD;  Location: [facility];  Service: General;  Laterality: Right;     Family History  Problem Relation Age of Onset   Alzheimer's disease Mother    Stroke Mother    Breast cancer Mother    Heart failure Father    Ovarian cancer Neg Hx    Colon cancer Neg Hx    Diabetes Neg Hx      Social History   Tobacco Use   Smoking status: Never   Smokeless tobacco: Never  Vaping Use   Vaping status: Never Used  Substance Use Topics   Alcohol use: No   Drug use: No  Allergies  Allergen Reactions   Levofloxacin     Caused arm pain.    Current Outpatient Medications  Medication Sig Dispense Refill   amphetamine-dextroamphetamine (ADDERALL) 10 MG tablet Take 10 mg by mouth 2 (two) times a day.     aspirin EC 81 MG tablet Take 1 tablet (81 mg total) by mouth daily.     bisoprolol-hydrochlorothiazide (ZIAC) 10-6.25 MG tablet Take 1 tablet by mouth daily.     cyanocobalamin 100 MCG tablet Take 100 mcg by mouth daily.     fexofenadine (ALLEGRA) 180 MG tablet Take 180 mg by mouth daily as needed for allergies or rhinitis.     levothyroxine (SYNTHROID) 25 MCG tablet Take 25 mcg by mouth every morning.     estradiol (ESTRACE) 0.1 MG/GM vaginal cream Place 1 Applicatorful vaginally daily. Use daily x 6 weeks post op (Patient not taking: Reported on 08/22/2023)  42.5 g 12   No current facility-administered medications for this visit.      REVIEW OF SYSTEMS (Negative unless checked)  Constitutional: [] Weight loss  [] Fever  [] Chills Cardiac: [] Chest pain   [] Chest pressure   [] Palpitations   [] Shortness of breath when laying flat   [] Shortness of breath at rest   [] Shortness of breath with exertion. Vascular:  [] Pain in legs with walking   [] Pain in legs at rest   [] Pain in legs when laying flat   [] Claudication   [] Pain in feet when walking  [] Pain in feet at rest  [] Pain in feet when laying flat   [] History of DVT   [] Phlebitis   [x] Swelling in legs   [] Varicose veins   [] Non-healing ulcers Pulmonary:   [] Uses home oxygen   [] Productive cough   [] Hemoptysis   [] Wheeze  [] COPD   [] Asthma Neurologic:  [] Dizziness  [] Blackouts   [] Seizures   [] History of stroke   [] History of TIA  [] Aphasia   [] Temporary blindness   [] Dysphagia   [] Weakness or numbness in arms   [] Weakness or numbness in legs Musculoskeletal:  [x] Arthritis   [] Joint swelling   [x] Joint pain   [] Low back pain Hematologic:  [] Easy bruising  [] Easy bleeding   [] Hypercoagulable state   [] Anemic  [] Hepatitis Gastrointestinal:  [] Blood in stool   [] Vomiting blood  [] Gastroesophageal reflux/heartburn   [] Abdominal pain Genitourinary:  [] Chronic kidney disease   [] Difficult urination  [] Frequent urination  [] Burning with urination   [] Hematuria Skin:  [] Rashes   [] Ulcers   [] Wounds Psychological:  [] History of anxiety   []  History of major depression.    Physical Exam BP (!) 190/97   Pulse 68   Resp 18   Ht 4\' 11"  (1.499 m)   Wt 126 lb 9.6 oz (57.4 kg)   BMI 25.57 kg/m  Gen:  WD/WN, NAD Head: [place]/AT, No temporalis wasting.  Ear/Nose/Throat: Hearing grossly intact, nares w/o erythema or drainage, oropharynx w/o Erythema/Exudate Eyes: Conjunctiva clear, sclera non-icteric  Neck: trachea midline.  No JVD.  Pulmonary:  Good air movement, respirations not labored, no use of accessory  muscles  Cardiac: RRR, no JVD Vascular:  Vessel Right Left  Radial Palpable Palpable                          PT NP NP  DP 1+ 1+   Gastrointestinal:. No masses, surgical incisions, or scars. Musculoskeletal: M/S 5/5 throughout.  Extremities without ischemic changes.  No deformity or atrophy. Moderate stasis dermatitis bilaterally. 1-2+ BLE edema. Neurologic: Sensation grossly  intact in extremities.  Symmetrical.  Speech is fluent. Motor exam as listed above. Psychiatric: Judgment and insight are fair. Not a great historian.  Dermatologic: No rashes or ulcers noted.  No cellulitis or open wounds.    Radiology No results found.  Labs No results found for this or any previous visit (from the past 2160 hours).  Assessment/Plan:  Swelling of limb Recommend:  I have had a long discussion with the patient regarding swelling and why it  causes symptoms.  Patient will begin wearing graduated compression on a daily basis a prescription was given. The patient will  wear the stockings first thing in the morning and removing them in the evening. The patient is instructed specifically not to sleep in the stockings.   In addition, behavioral modification will be initiated.  This will include frequent elevation, use of over the counter pain medications and exercise such as walking.  Consideration for a lymph pump will also be made based upon the effectiveness of conservative therapy.  This would help to improve the edema control and prevent sequela such as ulcers and infections   Patient should undergo duplex ultrasound of the venous system to ensure that DVT or reflux is not present.  The patient will follow-up with me after the ultrasound.   Essential hypertension blood pressure control important in reducing the progression of atherosclerotic disease. On appropriate oral medications.      Festus Barren 08/22/2023, 12:26 PM   This note was created with Dragon medical transcription  system.  Any errors from dictation are unintentional.

## 2023-08-22 NOTE — Patient Instructions (Signed)
Edema  Edema is an abnormal buildup of fluids in the body tissues and under the skin. Swelling of the legs, feet, and ankles is a common symptom that becomes more likely as you get older. Swelling is also common in looser tissues, such as around the eyes. Pressing on the area may make a temporary dent in your skin (pitting edema). This fluid may also accumulate in your lungs (pulmonary edema). There are many possible causes of edema. Eating too much salt (sodium) and being on your feet or sitting for a long time can cause edema in your legs, feet, and ankles. Common causes of edema include: Certain medical conditions, such as heart failure, liver or kidney disease, and cancer. Weak leg blood vessels. An injury. Pregnancy. Medicines. Being obese. Low protein levels in the blood. Hot weather may make edema worse. Edema is usually painless. Your skin may look swollen or shiny. Follow these instructions at home: Medicines Take over-the-counter and prescription medicines only as told by your health care provider. Your health care provider may prescribe a medicine to help your body get rid of extra water (diuretic). Take this medicine if you are told to take it. Eating and drinking Eat a low-salt (low-sodium) diet to reduce fluid as told by your health care provider. Sometimes, eating less salt may reduce swelling. Depending on the cause of your swelling, you may need to limit how much fluid you drink (fluid restriction). General instructions Raise (elevate) the injured area above the level of your heart while you are sitting or lying down. Do not sit still or stand for long periods of time. Do not wear tight clothing. Do not wear garters on your upper legs. Exercise your legs to get your circulation going. This helps to move the fluid back into your blood vessels, and it may help the swelling go down. Wear compression stockings as told by your health care provider. These stockings help to prevent  blood clots and reduce swelling in your legs. It is important that these are the correct size. These stockings should be prescribed by your health care provider to prevent possible injuries. If elastic bandages or wraps are recommended, use them as told by your health care provider. Contact a health care provider if: Your edema does not get better with treatment. You have heart, liver, or kidney disease and have symptoms of edema. You have sudden and unexplained weight gain. Get help right away if: You develop shortness of breath or chest pain. You cannot breathe when you lie down. You develop pain, redness, or warmth in the swollen areas. You have heart, liver, or kidney disease and suddenly get edema. You have a fever and your symptoms suddenly get worse. These symptoms may be an emergency. Get help right away. Call 911. Do not wait to see if the symptoms will go away. Do not drive yourself to the hospital. Summary Edema is an abnormal buildup of fluids in the body tissues and under the skin. Eating too much salt (sodium)and being on your feet or sitting for a long time can cause edema in your legs, feet, and ankles. Raise (elevate) the injured area above the level of your heart while you are sitting or lying down. Follow your health care provider's instructions about diet and how much fluid you can drink. This information is not intended to replace advice given to you by your health care provider. Make sure you discuss any questions you have with your health care provider. Document Revised: 02/19/2021 Document  Reviewed: 02/19/2021 Elsevier Patient Education  2024 [employer].

## 2023-08-22 NOTE — Assessment & Plan Note (Signed)
 blood pressure control important in reducing the progression of atherosclerotic disease. On appropriate oral medications.

## 2023-08-22 NOTE — Assessment & Plan Note (Signed)

## 2023-10-01 ENCOUNTER — Ambulatory Visit (INDEPENDENT_AMBULATORY_CARE_PROVIDER_SITE_OTHER): Payer: Medicare Other | Admitting: Nurse Practitioner

## 2023-10-01 ENCOUNTER — Encounter (INDEPENDENT_AMBULATORY_CARE_PROVIDER_SITE_OTHER): Payer: Self-pay | Admitting: Nurse Practitioner

## 2023-10-01 ENCOUNTER — Ambulatory Visit (INDEPENDENT_AMBULATORY_CARE_PROVIDER_SITE_OTHER): Payer: Medicare Other

## 2023-10-01 VITALS — BP 190/97 | HR 61 | Resp 16 | Wt 127.8 lb

## 2023-10-01 DIAGNOSIS — I10 Essential (primary) hypertension: Secondary | ICD-10-CM | POA: Diagnosis not present

## 2023-10-01 DIAGNOSIS — M7989 Other specified soft tissue disorders: Secondary | ICD-10-CM

## 2023-10-01 NOTE — Progress Notes (Signed)
 Subjective:    Patient ID: Lorraine Ferguson, female    DOB: [birth year], 83 y.o.   MRN: [id number] Chief Complaint  Patient presents with   Follow-up    1 month ble reflux follow up    Lorraine Ferguson is a 83 y.o. female.  She returns today for evaluation of lower extremity edema.  She has had intermittent swelling for years.  She notes that the swelling had become more pronounced although today it seems to be under relative control.  She noticed a discoloration of both extremities which appears to be in line with stasis dermatitis.  She has concern over what appear to be actinic keratosis in the bilateral lower extremities.  She has some indication that these were being followed by dermatology several years ago but she was lost to follow-up.  She notes that she was given a referral to dermatology but does not like that office and may want to see the previous dermatologist that she saw.  Unfortunately she is a somewhat poor historian.  She notes that she has not been wearing compression socks but that she does have some at home.  She has been elevating her legs more since her most recent visit and she is not as active due to being unsteady.  She currently has no open wounds or ulcerations  No evidence of DVT or superficial phlebitis.  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  There is evidence of a prior ablation in the right great saphenous vein.  No evidence of superficial venous reflux in the left great saphenous vein.    Review of Systems  Cardiovascular:  Positive for leg swelling.  All other systems reviewed and are negative.      Objective:   Physical Exam Vitals reviewed.  HENT:     Head: Normocephalic.  Cardiovascular:     Rate and Rhythm: Normal rate.  Pulmonary:     Effort: Pulmonary effort is normal.  Musculoskeletal:     Right lower leg: Edema present.     Left lower leg: Edema present.  Skin:    General: Skin is warm and dry.  Neurological:      Mental Status: She is alert and oriented to person, place, and time.  Psychiatric:        Mood and Affect: Mood normal.        Behavior: Behavior normal.        Thought Content: Thought content normal.        Judgment: Judgment normal.     BP (!) 190/97   Pulse 61   Resp 16   Wt 127 lb 12.8 oz (58 kg)   BMI 25.81 kg/m   Past Medical History:  Diagnosis Date   Chiari malformation    hx with cervical syringomyelia, s/p surgery at [facility] 10-12 years ago   Depression    Fibrocystic breast disease    HTN (hypertension)    Hyperparathyroidism    s/p parathyroidectomy in 9/10   Hypothyroidism    Normal echocardiogram    EF 50-55%, mild LVH, trivial AI, no other valvular disease   RBBB (right bundle branch block with left anterior fascicular block)    Recurrent UTI    and urinary incontinence    Social History   Socioeconomic History   Marital status: Married    Spouse name: Not on file   Number of children: Not on file   Years of education: Not on file   Highest  education level: Not on file  Occupational History   Not on file  Tobacco Use   Smoking status: Never   Smokeless tobacco: Never  Vaping Use   Vaping status: Never Used  Substance and Sexual Activity   Alcohol use: No   Drug use: No   Sexual activity: Yes    Birth control/protection: Surgical  Other Topics Concern   Not on file  Social History Narrative   Retired. Lives in [place]. Does not regularly exercise   Social Drivers of Health   Financial Resource Strain: Low Risk  (02/26/2023)   Received from [facility]   Overall Financial Resource Strain (CARDIA)    Difficulty of Paying Living Expenses: Not hard at all  Food Insecurity: No Food Insecurity (02/26/2023)   [facility]   Hunger Vital Sign    Worried About Running Out of Food in the Last Year: Never true    Ran Out of Food in the Last Year: Never true  Transportation Needs: No  Transportation Needs (02/26/2023)   Received from [facility]    In the past 12 months, has lack of transportation kept you from medical appointments or from getting medications?: No    Lack of Transportation (Non-Medical): No  Physical Activity: Not on file  Stress: Not on file  Social Connections: Not on file  Intimate Partner Violence: Not on file    Past Surgical History:  Procedure Laterality Date   ABDOMINAL HYSTERECTOMY     APPENDECTOMY     chiari malformation     hysterectomy (other)     INGUINAL HERNIA REPAIR Right 12/20/2018   Procedure: HERNIA REPAIR INGUINAL INCARCERATED;  Surgeon: Carolan Shiver, MD;  Location: [facility] OR[facility];  Service: General;  Laterality: Right;   leg vein     parathyroid surgery     ROBOTIC ASSISTED LAPAROSCOPIC SACROCOLPOPEXY N/A 12/08/2020   Procedure: XI ROBOTIC ASSISTED LAPAROSCOPIC SACROCOLPOPEXY;  Surgeon: Vanna Scotland, MD;  Location: [facility];  Service: Urology;  Laterality: N/A;   XI ROBOTIC ASSISTED INGUINAL HERNIA REPAIR WITH MESH Right 12/08/2020   Procedure: XI ROBOTIC ASSISTED vs Open INGUINAL HERNIA REPAIR WITH MESH;  Surgeon: Carolan Shiver, MD;  Location: [facility];  Service: General;  Laterality: Right;    Family History  Problem Relation Age of Onset   Alzheimer's disease Mother    Stroke Mother    Breast cancer Mother    Heart failure Father    Ovarian cancer Neg Hx    Colon cancer Neg Hx    Diabetes Neg Hx     Allergies  Allergen Reactions   Levofloxacin     Caused arm pain.       Latest Ref Rng & Units 12/09/2020    5:00 AM 12/21/2018    4:39 AM 12/20/2018    1:01 PM  CBC  WBC 4.0 - 10.5 K/uL 9.3  11.1  13.7   Hemoglobin 12.0 - 15.0 g/dL 40.9  81.1  91.4   Hematocrit 36.0 - 46.0 % 36.5  40.2  42.9   Platelets 150 - 400 K/uL 275  303  316       CMP     Component Value Date/Time   NA 137 12/09/2020 0500   K 4.4 12/09/2020 0500   CL 105 12/09/2020 0500    CO2 25 12/09/2020 0500   GLUCOSE 123 (H) 12/09/2020 0500   BUN 25 (H) 12/09/2020 0500   CREATININE 1.10 (  H) 12/09/2020 0500   CREATININE 0.84 10/12/2010 0907   CALCIUM 8.2 (L) 12/09/2020 0500   PROT 7.1 12/20/2018 1301   ALBUMIN 3.6 12/20/2018 1301   AST 18 12/20/2018 1301   ALT 12 12/20/2018 1301   ALKPHOS 73 12/20/2018 1301   BILITOT 1.4 (H) 12/20/2018 1301   GFRNONAA 51 (L) 12/09/2020 0500     No results found.     Assessment & Plan:   1. Swelling of limb (Primary) Today I had a long discussion with the patient regarding lower extremity swelling and the importance of conservative therapy.  I reinforced the use of medical grade compression, elevation and activity as first-line treatment steps for lower extremity edema.  As far as the areas that she is concerned with, I think it would be prudent and beneficial for her to visit dermatology to possibly have these areas biopsied.  We also discussed that if her conservative therapy is not helpful with alleviating the swelling, we can consider a lymphedema pump.  Will have the patient return in 6 months to evaluate progress with lower extremity edema.  2. Essential hypertension Continue antihypertensive medications as already ordered, these medications have been reviewed and there are no changes at this time.   Current Outpatient Medications on File Prior to Visit  Medication Sig Dispense Refill   amphetamine-dextroamphetamine (ADDERALL) 10 MG tablet Take 10 mg by mouth 2 (two) times a day.     aspirin EC 81 MG tablet Take 1 tablet (81 mg total) by mouth daily.     bisoprolol-hydrochlorothiazide (ZIAC) 10-6.25 MG tablet Take 1 tablet by mouth daily.     cyanocobalamin 100 MCG tablet Take 100 mcg by mouth daily.     fexofenadine (ALLEGRA) 180 MG tablet Take 180 mg by mouth daily as needed for allergies or rhinitis.     levothyroxine (SYNTHROID) 25 MCG tablet Take 25 mcg by mouth every morning.     estradiol (ESTRACE) 0.1 MG/GM vaginal  cream Place 1 Applicatorful vaginally daily. Use daily x 6 weeks post op (Patient not taking: Reported on 03/08/2022) 42.5 g 12   No current facility-administered medications on file prior to visit.    There are no Patient Instructions on file for this visit. No follow-ups on file.   Georgiana Spinner, NP

## 2023-11-14 ENCOUNTER — Ambulatory Visit: Admitting: Podiatry

## 2023-11-18 ENCOUNTER — Encounter (INDEPENDENT_AMBULATORY_CARE_PROVIDER_SITE_OTHER): Payer: Self-pay

## 2024-04-02 ENCOUNTER — Ambulatory Visit (INDEPENDENT_AMBULATORY_CARE_PROVIDER_SITE_OTHER): Admitting: Nurse Practitioner

## 2024-06-17 ENCOUNTER — Ambulatory Visit: Admitting: Obstetrics and Gynecology

## 2024-07-12 ENCOUNTER — Encounter: Payer: Self-pay | Admitting: *Deleted

## 2024-08-17 ENCOUNTER — Ambulatory Visit: Admitting: Obstetrics and Gynecology
